# Patient Record
Sex: Female | Born: 1935
Health system: Southern US, Community
[De-identification: ages and names within clinical notes are randomized; demographics above are authoritative.]

## PROBLEM LIST (undated history)

## (undated) DIAGNOSIS — R5383 Other fatigue: Secondary | ICD-10-CM

## (undated) DIAGNOSIS — G479 Sleep disorder, unspecified: Secondary | ICD-10-CM

## (undated) DIAGNOSIS — F39 Unspecified mood [affective] disorder: Secondary | ICD-10-CM

## (undated) DIAGNOSIS — R300 Dysuria: Secondary | ICD-10-CM

## (undated) DIAGNOSIS — M4804 Spinal stenosis, thoracic region: Secondary | ICD-10-CM

## (undated) DIAGNOSIS — R011 Cardiac murmur, unspecified: Secondary | ICD-10-CM

## (undated) DIAGNOSIS — E785 Hyperlipidemia, unspecified: Secondary | ICD-10-CM

## (undated) DIAGNOSIS — K5792 Diverticulitis of intestine, part unspecified, without perforation or abscess without bleeding: Secondary | ICD-10-CM

## (undated) DIAGNOSIS — M150 Primary generalized (osteo)arthritis: Secondary | ICD-10-CM

## (undated) DIAGNOSIS — E7800 Pure hypercholesterolemia, unspecified: Secondary | ICD-10-CM

## (undated) DIAGNOSIS — G44209 Tension-type headache, unspecified, not intractable: Secondary | ICD-10-CM

## (undated) DIAGNOSIS — M859 Disorder of bone density and structure, unspecified: Secondary | ICD-10-CM

## (undated) DIAGNOSIS — I10 Essential (primary) hypertension: Secondary | ICD-10-CM

## (undated) HISTORY — DX: Disorder of bone density and structure, unspecified: M85.9

## (undated) HISTORY — DX: Cardiac murmur, unspecified: R01.1

## (undated) HISTORY — DX: Other fatigue: R53.83

## (undated) HISTORY — DX: Dysuria: R30.0

## (undated) HISTORY — DX: Pure hypercholesterolemia, unspecified: E78.00

## (undated) HISTORY — DX: Unspecified mood (affective) disorder: F39

## (undated) HISTORY — DX: Spinal stenosis, thoracic region: M48.04

## (undated) HISTORY — DX: Sleep disorder, unspecified: G47.9

## (undated) HISTORY — DX: Tension-type headache, unspecified, not intractable: G44.209

## (undated) HISTORY — PX: ABDOMINAL HYSTERECTOMY: SHX81

## (undated) HISTORY — PX: TONSILLECTOMY: SUR1361

## (undated) HISTORY — PX: APPENDECTOMY: SHX54

## (undated) HISTORY — PX: BLADDER SUSPENSION: SHX72

## (undated) HISTORY — DX: Primary generalized (osteo)arthritis: M15.0

---

## 1997-12-17 ENCOUNTER — Other Ambulatory Visit: Admission: RE | Admit: 1997-12-17 | Discharge: 1997-12-17 | Payer: Self-pay | Admitting: Obstetrics and Gynecology

## 1999-06-12 ENCOUNTER — Encounter: Admission: RE | Admit: 1999-06-12 | Discharge: 1999-06-12 | Payer: Self-pay | Admitting: Family Medicine

## 1999-06-12 ENCOUNTER — Encounter: Payer: Self-pay | Admitting: Family Medicine

## 1999-12-14 ENCOUNTER — Encounter: Payer: Self-pay | Admitting: Family Medicine

## 1999-12-14 ENCOUNTER — Encounter: Admission: RE | Admit: 1999-12-14 | Discharge: 1999-12-14 | Payer: Self-pay | Admitting: Family Medicine

## 2001-01-02 ENCOUNTER — Encounter: Payer: Self-pay | Admitting: Family Medicine

## 2001-01-02 ENCOUNTER — Encounter: Admission: RE | Admit: 2001-01-02 | Discharge: 2001-01-02 | Payer: Self-pay | Admitting: Family Medicine

## 2001-01-05 ENCOUNTER — Encounter: Payer: Self-pay | Admitting: Family Medicine

## 2001-01-05 ENCOUNTER — Encounter: Admission: RE | Admit: 2001-01-05 | Discharge: 2001-01-05 | Payer: Self-pay | Admitting: Family Medicine

## 2001-10-03 ENCOUNTER — Ambulatory Visit (HOSPITAL_COMMUNITY): Admission: RE | Admit: 2001-10-03 | Discharge: 2001-10-03 | Payer: Self-pay | Admitting: Gastroenterology

## 2002-02-05 ENCOUNTER — Encounter: Admission: RE | Admit: 2002-02-05 | Discharge: 2002-02-05 | Payer: Self-pay | Admitting: Family Medicine

## 2002-02-05 ENCOUNTER — Encounter: Payer: Self-pay | Admitting: Family Medicine

## 2002-02-14 ENCOUNTER — Encounter: Admission: RE | Admit: 2002-02-14 | Discharge: 2002-02-14 | Payer: Self-pay | Admitting: Family Medicine

## 2002-02-14 ENCOUNTER — Encounter: Payer: Self-pay | Admitting: Family Medicine

## 2002-09-17 ENCOUNTER — Other Ambulatory Visit: Admission: RE | Admit: 2002-09-17 | Discharge: 2002-09-17 | Payer: Self-pay | Admitting: Family Medicine

## 2003-02-19 ENCOUNTER — Encounter: Admission: RE | Admit: 2003-02-19 | Discharge: 2003-02-19 | Payer: Self-pay | Admitting: Family Medicine

## 2003-02-19 ENCOUNTER — Encounter: Payer: Self-pay | Admitting: Family Medicine

## 2004-01-24 ENCOUNTER — Other Ambulatory Visit: Admission: RE | Admit: 2004-01-24 | Discharge: 2004-01-24 | Payer: Self-pay | Admitting: Family Medicine

## 2004-02-20 ENCOUNTER — Encounter: Admission: RE | Admit: 2004-02-20 | Discharge: 2004-02-20 | Payer: Self-pay | Admitting: Family Medicine

## 2005-02-02 ENCOUNTER — Other Ambulatory Visit: Admission: RE | Admit: 2005-02-02 | Discharge: 2005-02-02 | Payer: Self-pay | Admitting: Family Medicine

## 2005-02-09 ENCOUNTER — Encounter: Admission: RE | Admit: 2005-02-09 | Discharge: 2005-02-09 | Payer: Self-pay | Admitting: Family Medicine

## 2005-03-04 ENCOUNTER — Encounter: Admission: RE | Admit: 2005-03-04 | Discharge: 2005-03-04 | Payer: Self-pay | Admitting: Family Medicine

## 2005-12-06 ENCOUNTER — Encounter: Admission: RE | Admit: 2005-12-06 | Discharge: 2005-12-06 | Payer: Self-pay | Admitting: Family Medicine

## 2005-12-27 ENCOUNTER — Other Ambulatory Visit: Payer: Self-pay

## 2005-12-27 ENCOUNTER — Emergency Department: Payer: Self-pay | Admitting: Unknown Physician Specialty

## 2006-04-07 ENCOUNTER — Other Ambulatory Visit: Admission: RE | Admit: 2006-04-07 | Discharge: 2006-04-07 | Payer: Self-pay | Admitting: Family Medicine

## 2006-12-26 ENCOUNTER — Encounter: Admission: RE | Admit: 2006-12-26 | Discharge: 2006-12-26 | Payer: Self-pay | Admitting: Family Medicine

## 2008-01-03 ENCOUNTER — Encounter: Admission: RE | Admit: 2008-01-03 | Discharge: 2008-01-03 | Payer: Self-pay | Admitting: Family Medicine

## 2008-01-23 ENCOUNTER — Ambulatory Visit: Payer: Self-pay | Admitting: Critical Care Medicine

## 2008-01-23 ENCOUNTER — Inpatient Hospital Stay (HOSPITAL_COMMUNITY): Admission: RE | Admit: 2008-01-23 | Discharge: 2008-02-06 | Payer: Self-pay | Admitting: Neurosurgery

## 2008-03-12 ENCOUNTER — Other Ambulatory Visit: Admission: RE | Admit: 2008-03-12 | Discharge: 2008-03-12 | Payer: Self-pay | Admitting: Family Medicine

## 2009-01-06 ENCOUNTER — Encounter: Admission: RE | Admit: 2009-01-06 | Discharge: 2009-01-06 | Payer: Self-pay | Admitting: Family Medicine

## 2009-01-15 ENCOUNTER — Encounter: Admission: RE | Admit: 2009-01-15 | Discharge: 2009-01-15 | Payer: Self-pay | Admitting: Neurosurgery

## 2009-02-03 ENCOUNTER — Encounter: Admission: RE | Admit: 2009-02-03 | Discharge: 2009-02-03 | Payer: Self-pay | Admitting: Neurosurgery

## 2009-05-07 ENCOUNTER — Encounter: Admission: RE | Admit: 2009-05-07 | Discharge: 2009-05-07 | Payer: Self-pay | Admitting: Neurosurgery

## 2009-07-14 ENCOUNTER — Encounter: Admission: RE | Admit: 2009-07-14 | Discharge: 2009-07-14 | Payer: Self-pay | Admitting: Neurosurgery

## 2009-07-17 ENCOUNTER — Encounter: Admission: RE | Admit: 2009-07-17 | Discharge: 2009-07-17 | Payer: Self-pay | Admitting: Neurosurgery

## 2009-10-23 ENCOUNTER — Encounter: Admission: RE | Admit: 2009-10-23 | Discharge: 2009-10-23 | Payer: Self-pay | Admitting: Neurosurgery

## 2010-01-21 ENCOUNTER — Encounter: Admission: RE | Admit: 2010-01-21 | Discharge: 2010-01-21 | Payer: Self-pay | Admitting: Family Medicine

## 2010-04-07 ENCOUNTER — Encounter: Admission: RE | Admit: 2010-04-07 | Discharge: 2010-04-07 | Payer: Self-pay | Admitting: Neurosurgery

## 2010-04-14 IMAGING — MG MM SCREEN MAMMOGRAM BILATERAL
4 series · 4 of 4 positions shown · non-contrast
Comparison: none

DG SCREEN MAMMOGRAM BILATERAL
Bilateral CC and MLO view(s) were taken.
Technologist: Husametin Camur

DIGITAL SCREENING MAMMOGRAM WITH CAD:
The breast tissue is extremely dense.  No masses or malignant type calcifications are identified.  
Compared with prior studies.

[R CC]
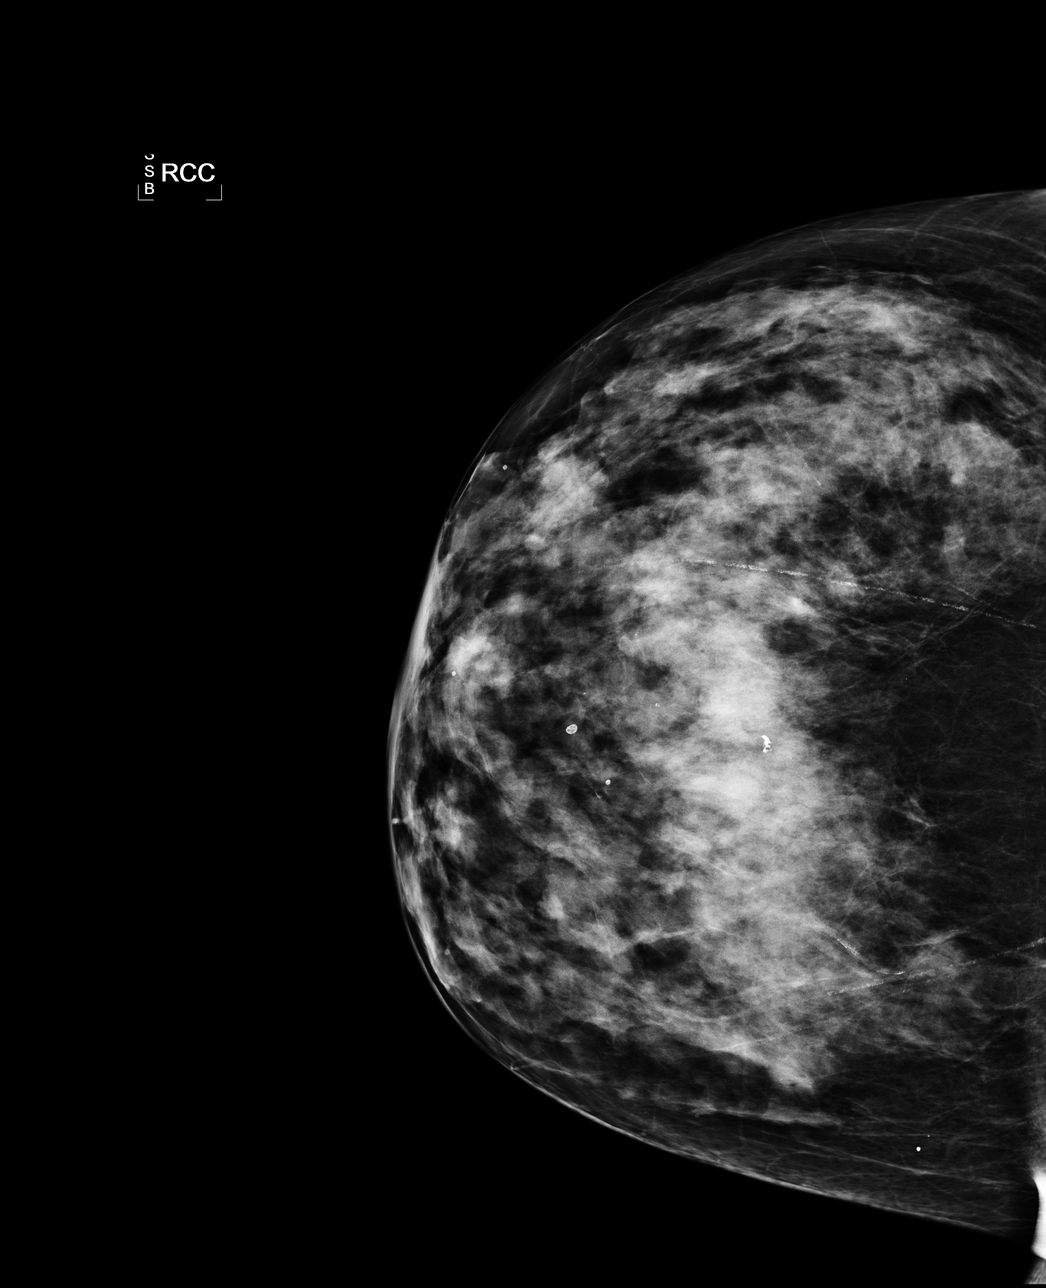

[L CC]
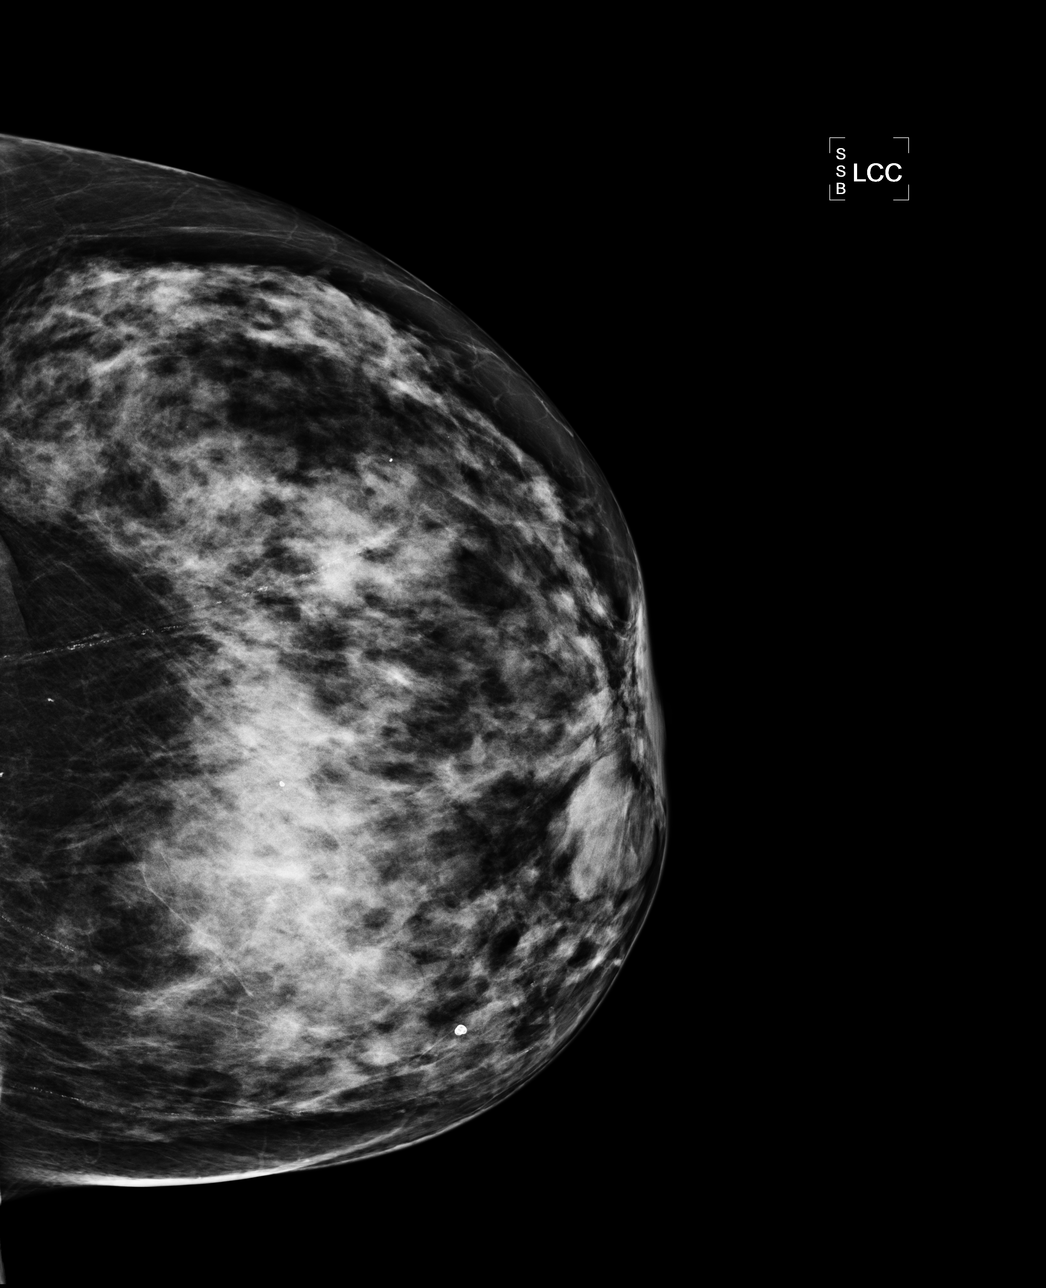

[L MLO]
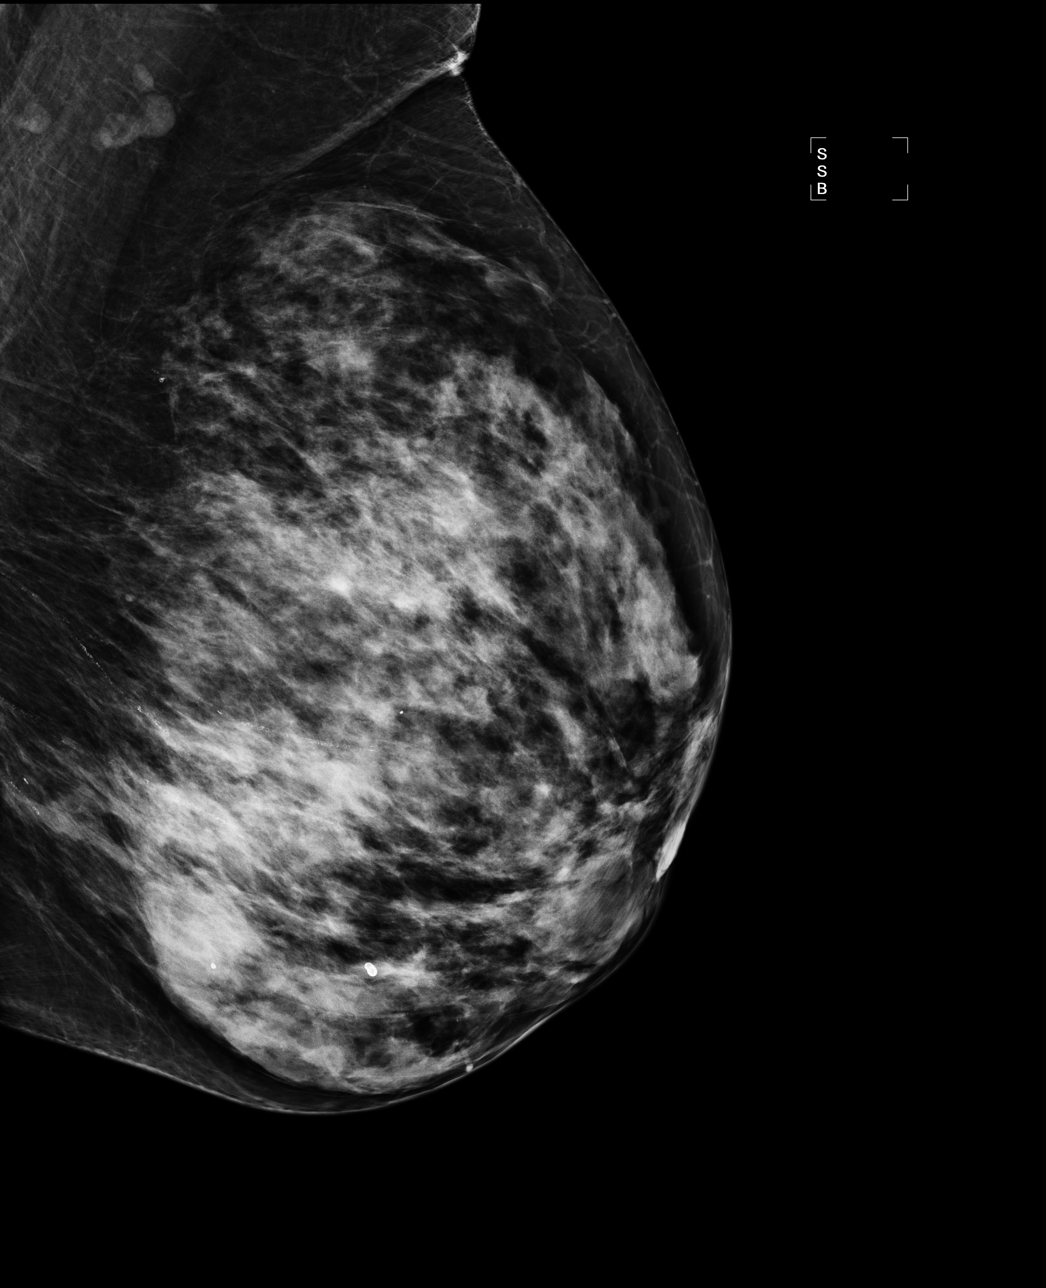

[R MLO]
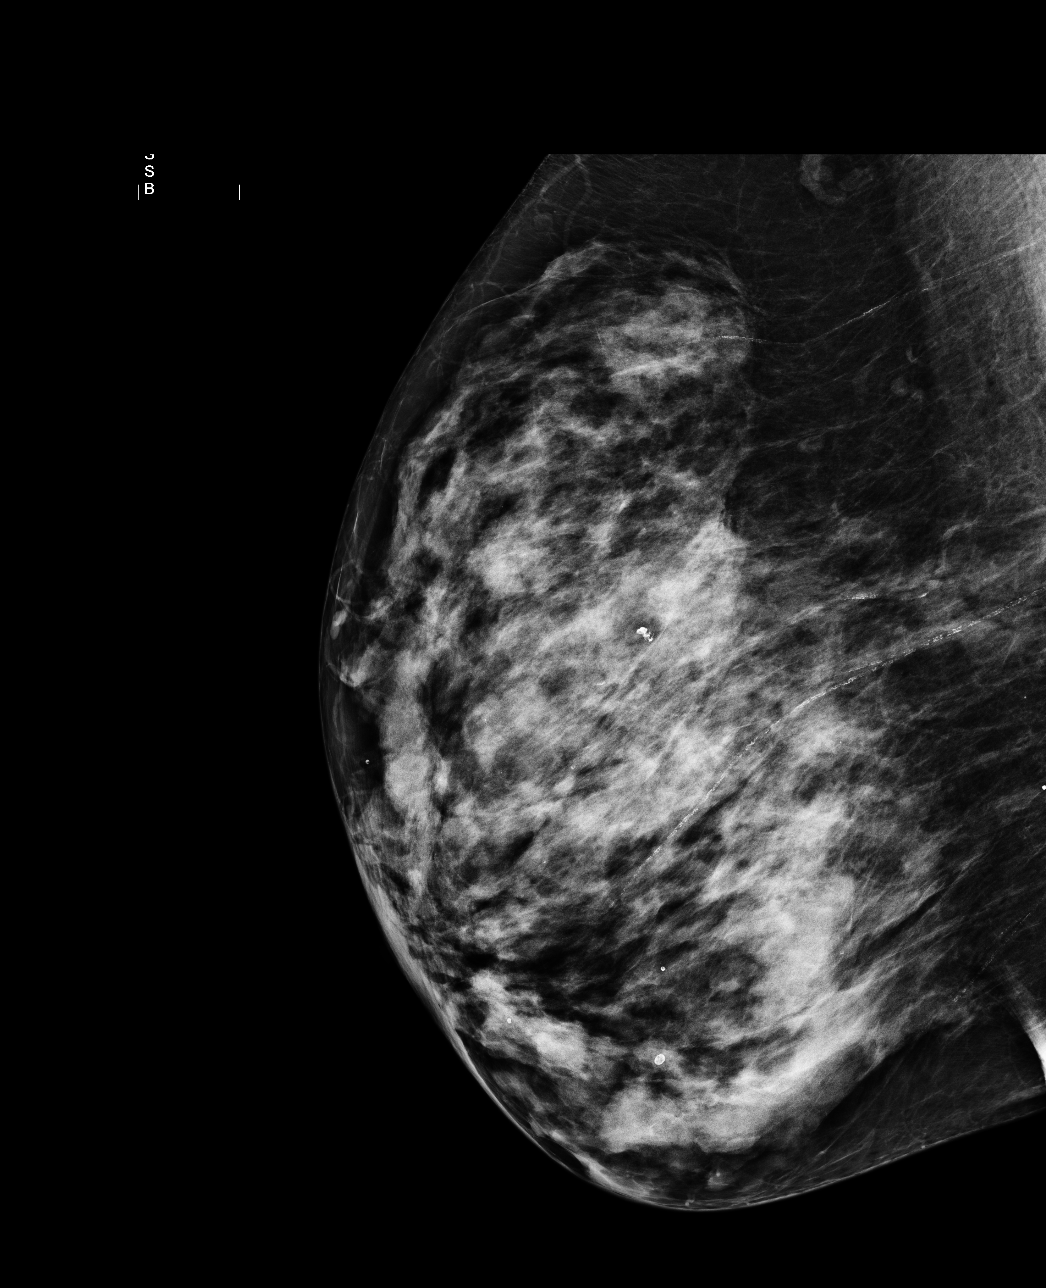

[4 of 4 positions shown; findings below may reference images not displayed]

IMPRESSION: No specific mammographic evidence of malignancy.  Next screening mammogram is recommended in one 
year.

ASSESSMENT: Negative - BI-RADS 1

Screening mammogram in 1 year.
ANALYZED BY COMPUTER AIDED DETECTION. , THIS PROCEDURE WAS A DIGITAL MAMMOGRAM.

## 2010-07-21 ENCOUNTER — Other Ambulatory Visit: Payer: Self-pay | Admitting: Neurosurgery

## 2010-07-21 DIAGNOSIS — M549 Dorsalgia, unspecified: Secondary | ICD-10-CM

## 2010-07-23 ENCOUNTER — Ambulatory Visit
Admission: RE | Admit: 2010-07-23 | Discharge: 2010-07-23 | Disposition: A | Discharge status: Home / Self Care | Payer: MEDICARE | Source: Ambulatory Visit | Attending: Neurosurgery | Admitting: Neurosurgery

## 2010-07-23 DIAGNOSIS — M549 Dorsalgia, unspecified: Secondary | ICD-10-CM

## 2010-08-03 ENCOUNTER — Other Ambulatory Visit: Payer: Self-pay | Admitting: Neurosurgery

## 2010-08-03 DIAGNOSIS — M47816 Spondylosis without myelopathy or radiculopathy, lumbar region: Secondary | ICD-10-CM

## 2010-08-06 ENCOUNTER — Ambulatory Visit
Admission: RE | Admit: 2010-08-06 | Discharge: 2010-08-06 | Disposition: A | Discharge status: Home / Self Care | Payer: MEDICARE | Source: Ambulatory Visit | Attending: Neurosurgery | Admitting: Neurosurgery

## 2010-08-06 DIAGNOSIS — M47816 Spondylosis without myelopathy or radiculopathy, lumbar region: Secondary | ICD-10-CM

## 2010-08-11 ENCOUNTER — Other Ambulatory Visit: Payer: Self-pay | Admitting: Neurosurgery

## 2010-08-11 DIAGNOSIS — M1990 Unspecified osteoarthritis, unspecified site: Secondary | ICD-10-CM

## 2010-08-14 ENCOUNTER — Inpatient Hospital Stay: Admission: RE | Admit: 2010-08-14 | Payer: No Typology Code available for payment source | Source: Ambulatory Visit

## 2010-10-06 NOTE — H&P (Signed)
Pamela Robinson, Pamela Robinson               ACCOUNT NO.:  [id number]   MEDICAL RECORD NO.:  [id number]          PATIENT TYPE:  INP   LOCATION:  2899                         FACILITY:  [facility]   PHYSICIAN:  Payton Doughty, M.D.      DATE OF BIRTH:  [birth year]   DATE OF ADMISSION:  01/23/2008  DATE OF DISCHARGE:                              HISTORY & PHYSICAL   ADMISSION DIAGNOSIS:  Cervical spondylosis from C4-T1.   The patient is very nice 77 year old right-handed self-referred white  lady working down in the yard and floor in February had the onset of  pain in her neck and dysesthesia in both arms.  MR shows cervical  spondylosis, C7-T1 subluxation, narrowing at 5-6.   MEDICAL HISTORY:  Is pretty benign.  She has history of hypertension.   MEDICATION:  She takes Diovan, atenolol, paroxetine, lovastatin,  Naprosyn, trazodone, and Fiorinal.   She is allergic to SULFA and CODEINE, as well as SHELLFISH.   SURGICAL HISTORY:  Tonsillectomy in 1946, appendectomy in 1956,  hysterectomy in 1976, and bladder tack in 1980.   SOCIAL HISTORY:  She does not smoke.  She is a social drinker.  Retired  from being a Architect.   FAMILY HISTORY:  Mom died at 57 of Alzheimer's, dad died at 9 of  natural causes.  She has a brother who died of colon cancer and sister  with breast cancer.   REVIEW OF SYSTEMS:  Remarkable for wearing glasses, hypertension,  occasional incontinence, back pain, arm pain, leg pain, joint pain,  arthritis, and neck pain.   PHYSICAL EXAMINATION:  HEENT:  Normal.  NECK:  She has somewhat limited range of motion of neck, but no  Lhermitte's.  CHEST:  Clear.  CARDIAC:  Regular rate and rhythm.  ABDOMEN:  Nontender.  No hepatosplenomegaly.  EXTREMITIES:  Without clubbing or cyanosis.  Peripheral pulses are good.  GU:  Deferred.  NEUROLOGIC:  She is awake, alert, and oriented.  Cranial nerves are  intact.  Motor exam shows 5/5 strength throughout the upper extremity.  She  has mildly positive Hoffman's bilaterally.  Reflexes are 2+ at the  biceps, 2+ at the triceps with brachioradialis, 3+ at the knees and  ankles.  No clonus and downgoing toes.   MR shows stenosis at C4-C5, C5-C6, not too bad at C6-C7, grade 2 slip C7-  T1 with narrowing at that level as well.   CLINICAL IMPRESSION:  Cervical spondylosis, early myelopathy.   PLAN:  Plan is for anterior decompression and fusion at C4-C5, C5-C6, C6-  C7, C7-T1.  If we cannot get  into C7-T1 anteriorly, we will do it from  behind.  Risks and benefits of this approach have been discussed with  her and she wishes to proceed.           ______________________________  Payton Doughty, M.D.     MWR/MEDQ  D:  01/23/2008  T:  01/23/2008  Job:  [id number]

## 2010-10-06 NOTE — Discharge Summary (Signed)
NAMEBRIANE, BIRDEN               ACCOUNT NO.:  [id number]   MEDICAL RECORD NO.:  [id number]          PATIENT TYPE:  INP   LOCATION:  3013                         FACILITY:  [facility]   PHYSICIAN:  Payton Doughty, M.D.      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   DISCHARGE DIAGNOSIS:  Cervical spondylotic myelopathy C3-4 through C6-7.   PROCEDURE:  C4-5, C5-6, C6-7, C7-T1 anterior decompression and fusion.   COMPLICATIONS:  Airway compromise and stridor.   DISCHARGE STATUS:  Alive and well.   BODY TEXT:  [name] with history and physical as recounted in  the chart.  She had been developing some weakness in her upper  extremities.  Films showed significant spondylosis C4-T1 and she was  admitted for decompression.   PHYSICAL EXAMINATION:  General and neurologic exam were intact for early  myelopathy.  History is fairly benign.  She was admitted after  ascertaining normal laboratory values and underwent the anterior  decompression from C4-5 through C7-T1.  Plate was not placed because  there was none that would fit.  Postoperatively, she initially did well  and then developed stridor with some swelling.  Next morning was taken  back to the OR for exploration of the incision.  No hematoma was found.  She did have extensive swelling.  She remained intubated for 2 days on  Decadron, swelling went down.  She self-extubated herself.  Since self-  extubation, her airway has been fine.  Her initial swallow study, she  could not swallow anything because of glottal edema.  She had a feeding  tube placed.  Three days later underwent a second swallowing study.  She  was placed on dysphagia one diet, did well on that for couple of days,  has had a further swallow study done and has had her diet advanced or  she  can drink thin liquids and take her pills in the chopped foods.  Currently, her strength is 4.  Incision is dry and well healing.  She is  tolerating her diet and is being discharged home.  Her follow up would  be in the [facility] office later this week for suture removal, and she  will have another swallow study next week to see if she can go to a  completely regular diet.           ______________________________  Payton Doughty, M.D.     MWR/MEDQ  D:  02/06/2008  T:  02/07/2008  Job:  [id number]

## 2010-10-06 NOTE — Op Note (Signed)
NAMECORTNI, TAYS NO.:  [id number]   MEDICAL RECORD NO.:  [id number]          PATIENT TYPE:  INP   LOCATION:  3111                         FACILITY:  [facility]   PHYSICIAN:  Payton Doughty, M.D.      DATE OF BIRTH:  1933-03-27   DATE OF PROCEDURE:  01/24/2008  DATE OF DISCHARGE:                               OPERATIVE REPORT   POSTOPERATIVE DIAGNOSIS:  Neck hematoma.   POSTOPERATIVE DIAGNOSIS:  Neck swelling.   PROCEDURE:  Exploration anterior cervical decompression incision.   SURGEON:  Payton Doughty, MD   COMPLICATIONS:  None.   BODY OF TEXT:  This is a 77 year old lady who had anterior decompression  and fusion done yesterday.  She developed some stridor.  Felt the  prudent thing is to explore and make sure she does not have a clot.  She  was taken to the operating room, smoothly anesthetized and intubated.  Following, prep and drape in the usual sterile fashion, the old skin  incision was reopened.  The sternocleidomastoid and carotid artery were  identified and careful dissection down through the old incision revealed  that there was really minimal if any hematoma, but rather diffuse soft  tissue swelling.  She had a large soft tissue in her neck and the  swelling probably will cause complication in the airway.  It was  irrigated with minimal clot found.  Jackson-Pratt drain placed in  prevertebral space and exited.  Successive layers of 3-0 Vicryl and 3-0  nylon were used to close.  Neosporin dressing was applied and made  occlusive with OpSite.  The patient returned to the recovery room to be  left intubated for a couple of days.           ______________________________  Payton Doughty, M.D.     MWR/MEDQ  D:  01/24/2008  T:  01/25/2008  Job:  [id number]

## 2010-10-06 NOTE — Op Note (Signed)
NAMEZULEYMA, Pamela Robinson               ACCOUNT NO.:  [id number]   MEDICAL RECORD NO.:  [id number]          PATIENT TYPE:  INP   LOCATION:  2899                         FACILITY:  [facility]   PHYSICIAN:  Payton Doughty, M.D.      DATE OF BIRTH:  08/13/1932   DATE OF PROCEDURE:  01/23/2008  DATE OF DISCHARGE:                               OPERATIVE REPORT   PREOPERATIVE DIAGNOSIS:  Spondylosis with myelopathy at C4-C5, C5-C6, C6-  C7, and C7-T1.   POSTOPERATIVE DIAGNOSIS:  Spondylosis with myelopathy at C4-C5, C5-C6,  C6-C7, and C7-T1.   OPERATIVE PROCEDURE:  C4-C5, C5-C6, C6-C7, C7-T1 anterior cervical  decompression and fusion.   SURGEON:  Payton Doughty, MD   ANESTHESIA:  General endotracheal.   PREPARATION:  Prepped and draped with alcohol wipe.   COMPLICATIONS:  None.   NURSE ASSISTANT:  [place].   DOCTOR ASSISTANT:  Cristi Loron, MD   BODY OF TEXT:  [name] with cervical spondylitic myelopathy.  She had grade 1 slip at C7-T1.  Taken to the operating room, smoothly  anesthetized and intubated, placed supine on the operating table, on the  halter head traction with the neck very slightly extended.  Following  shave, prep, and drape in usual sterile fashion, the skin was incised  from the sternal notch paralleling sternocleidomastoid muscle for  distance of approximately 10 cm.  The platysma was identified, elevated,  divided, and undermined.  Sternocleidomastoid was identified.  Medial  dissection revealed the carotid artery tracked laterally to left.  Trachea and esophagus retracted laterally to the right exposing the  bones in the anterior cervical spine.  Marker was placed.  Intraoperative x-ray obtained to confirm correct level and confirmed  correct level.  We took down the longus colli bilaterally and placed the  Shadow-Line self-retaining retractor.  Under gross observation, C4-C5,  C5-C6, C6-C7, and C7-T1 diskectomies were carried out.  We then brought  in  the operating microscope and used microdissection technique to remove  the remaining disks, remove the offending bone spurs, divide the  posterior longitudinal ligament, and explore the neural foramina  bilaterally.  Complete decompression at each level was obtained.  There  was a small slip at C7-T1.  Grossly, there was no instability after the  diskectomies.  The 8-mm bone grafts were fashioned with patellar  allograft and tapped into place at each site.  It was then attempted to  place Reflex hybrid plate, but because of the patient's anatomy the  holes on the 4-level plates would not line up with the native bone and  allow placement of screws.  There was an attempt made to place two 2-  level plates and once again the screws would not line up in a  satisfactory position.  It was therefore elected to forego a plate and  placed the patient in a collar and it was felt that she developed signs  of instability, she could be augmented from behind.  Therefore,  successive layers of 3-0 Vicryl and 4-0 Vicryl were used to close.  A  Jackson-Pratt drain was placed in  the prevertebral space connected by a  separate incision.  The esophagus was carefully inspected prior to  closure and found be free of lesions.  After closing, the patient was  then taken to recovery room in good condition.           ______________________________  Payton Doughty, M.D.     MWR/MEDQ  D:  01/23/2008  T:  01/24/2008  Job:  [id number]

## 2010-10-09 NOTE — Procedures (Signed)
[facility]  Patient:    Pamela Robinson, Pamela Robinson Visit Number: [id number] MRN: [id number]          Service Type: END Location: ENDO Attending Physician:  Orland Mustard Dictated by:   Llana Aliment. Randa Evens, M.D. Proc. Date: 10/03/01 Admit Date:  10/03/2001   CC:         Consuella Lose C. Valentina Lucks, M.D.   Procedure Report  DATE OF BIRTH:  06/21/1932  PROCEDURE:  Colonoscopy.  MEDICATIONS:  Fentanyl 125 mcg, Versed 10 mg IV.  SCOPE:  Olympus pediatric video colonoscope.  INDICATIONS FOR PROCEDURE:  Strong positive family history of colon cancer, brother, maternal uncle, paternal uncle all have had colon cancer. Brother and sister have also had colon polyps. For this reason, she is on protocol for every five year screening colonoscopy. This is done as a five year follow-up.  DESCRIPTION OF PROCEDURE:  The procedure had been explained to the patient and consent obtained. With the patient in the left lateral decubitus position, the Olympus pediatric video colonoscope was inserted and advanced under direct visualization. The prep was excellent. We were able to reach the cecum using abdominal pressure and position changes. The scope was withdrawn and the cecum, ascending colon, hepatic flexure, transverse colon, splenic flexure, descending and sigmoid colon were seen well. No polyps or other lesions were seen. The scope was withdrawn. The patient tolerated the procedure well with no abnormalities. She was maintained on low flow oxygen and pulse oximeter throughout the procedure.  ASSESSMENT:  Normal colonoscopy in high risk individual.  PLAN:  Yearly Hemoccults and will recommend repeating procedure in five years. Dictated by:   Llana Aliment. Randa Evens, M.D. Attending Physician:  Orland Mustard DD:  10/03/01 TD:  10/04/01 Job: [id number]

## 2010-11-24 ENCOUNTER — Other Ambulatory Visit: Payer: Self-pay | Admitting: Family Medicine

## 2010-11-24 ENCOUNTER — Other Ambulatory Visit (HOSPITAL_COMMUNITY)
Admission: RE | Admit: 2010-11-24 | Discharge: 2010-11-24 | Disposition: A | Discharge status: Home / Self Care | Payer: Medicare Other | Source: Ambulatory Visit | Attending: Family Medicine | Admitting: Family Medicine

## 2010-11-24 DIAGNOSIS — Z124 Encounter for screening for malignant neoplasm of cervix: Secondary | ICD-10-CM | POA: Insufficient documentation

## 2010-12-21 ENCOUNTER — Other Ambulatory Visit: Payer: Self-pay | Admitting: Family Medicine

## 2010-12-21 DIAGNOSIS — Z1231 Encounter for screening mammogram for malignant neoplasm of breast: Secondary | ICD-10-CM

## 2011-01-27 ENCOUNTER — Ambulatory Visit
Admission: RE | Admit: 2011-01-27 | Discharge: 2011-01-27 | Disposition: A | Discharge status: Home / Self Care | Payer: Medicare Other | Source: Ambulatory Visit | Attending: Family Medicine | Admitting: Family Medicine

## 2011-01-27 DIAGNOSIS — Z1231 Encounter for screening mammogram for malignant neoplasm of breast: Secondary | ICD-10-CM

## 2011-02-24 LAB — BASIC METABOLIC PANEL WITH GFR
BUN: 5 — ABNORMAL LOW
BUN: 6
BUN: 6
CO2: 27
CO2: 30
CO2: 30
Calcium: 8.2 — ABNORMAL LOW
Calcium: 8.4
Calcium: 8.7
Chloride: 105
Chloride: 92 — ABNORMAL LOW
Chloride: 98
Creatinine, Ser: 0.45
Creatinine, Ser: 0.5
Creatinine, Ser: 0.52
GFR calc non Af Amer: >60
GFR calc non Af Amer: >60
GFR calc non Af Amer: >60
Glucose, Bld: 131 — ABNORMAL HIGH
Glucose, Bld: 138 — ABNORMAL HIGH
Glucose, Bld: 170 — ABNORMAL HIGH
Potassium: 3.2 — ABNORMAL LOW
Potassium: 3.3 — ABNORMAL LOW
Potassium: 3.6
Sodium: 130 — ABNORMAL LOW
Sodium: 135
Sodium: 140

## 2011-02-24 LAB — HEMOGLOBIN AND HEMATOCRIT, BLOOD: HCT: 28 — ABNORMAL LOW

## 2011-02-24 LAB — CULTURE, BAL-QUANTITATIVE W GRAM STAIN: Colony Count: 75000

## 2011-02-24 LAB — CBC
HCT: 25.9 — ABNORMAL LOW
HCT: 30.8 — ABNORMAL LOW
HCT: 32.5 — ABNORMAL LOW
Hemoglobin: 11.4 — ABNORMAL LOW
Hemoglobin: 9 — ABNORMAL LOW
MCHC: 34.3
MCHC: 34.8
MCHC: 34.8
MCHC: 35.2
MCV: 88.7
MCV: 90.1
MCV: 90.8
Platelets: 190
Platelets: 260
Platelets: 286
RBC: 2.9 — ABNORMAL LOW
RBC: 3.67 — ABNORMAL LOW
RDW: 11.8
RDW: 11.9
RDW: 12
WBC: 11.6 — ABNORMAL HIGH

## 2011-02-24 LAB — BASIC METABOLIC PANEL
BUN: 2 — ABNORMAL LOW
CO2: 30
Calcium: 8.9
Chloride: 97
Chloride: 99
GFR calc Af Amer: >60
Glucose, Bld: 131 — ABNORMAL HIGH
Glucose, Bld: 138 — ABNORMAL HIGH
Potassium: 2.9 — ABNORMAL LOW
Potassium: 4.1
Sodium: 136

## 2011-02-24 LAB — APTT: aPTT: 32

## 2011-02-24 LAB — GLUCOSE, CAPILLARY
Glucose-Capillary: 134 — ABNORMAL HIGH
Glucose-Capillary: 157 — ABNORMAL HIGH
Glucose-Capillary: 161 — ABNORMAL HIGH
Glucose-Capillary: 168 — ABNORMAL HIGH

## 2011-02-24 LAB — BLOOD GAS, ARTERIAL
Acid-Base Excess: 4.7 — ABNORMAL HIGH
Bicarbonate: 27.6 — ABNORMAL HIGH
Drawn by: 277331
FIO2: 0.5
RATE: 14
TCO2: 28.6
pCO2 arterial: 33.1 — ABNORMAL LOW
pO2, Arterial: 109 — ABNORMAL HIGH

## 2011-02-24 LAB — DIFFERENTIAL
Eosinophils Absolute: 0.1
Eosinophils Relative: 1
Monocytes Absolute: 1.1 — ABNORMAL HIGH

## 2011-02-24 LAB — PROTIME-INR
INR: 1.1
Prothrombin Time: 14

## 2011-02-24 LAB — IRON AND TIBC
Iron: 17 — ABNORMAL LOW
Saturation Ratios: 10 — ABNORMAL LOW
TIBC: 173 — ABNORMAL LOW
UIBC: 156

## 2011-02-24 LAB — RETICULOCYTES
RBC.: 3.53 — ABNORMAL LOW
Retic Count, Absolute: 45.9
Retic Ct Pct: 1.3

## 2011-02-24 LAB — VITAMIN B12: Vitamin B-12: 448 (ref 211–911)

## 2011-02-24 LAB — PHOSPHORUS: Phosphorus: 3.3

## 2011-02-24 LAB — FERRITIN: Ferritin: 529 — ABNORMAL HIGH (ref 10–291)

## 2011-06-24 ENCOUNTER — Other Ambulatory Visit: Payer: Self-pay | Admitting: Neurosurgery

## 2011-06-24 DIAGNOSIS — M47816 Spondylosis without myelopathy or radiculopathy, lumbar region: Secondary | ICD-10-CM

## 2011-06-29 ENCOUNTER — Ambulatory Visit
Admission: RE | Admit: 2011-06-29 | Discharge: 2011-06-29 | Disposition: A | Discharge status: Home / Self Care | Payer: Medicare Other | Source: Ambulatory Visit | Attending: Neurosurgery | Admitting: Neurosurgery

## 2011-06-29 DIAGNOSIS — M47816 Spondylosis without myelopathy or radiculopathy, lumbar region: Secondary | ICD-10-CM

## 2011-06-29 DIAGNOSIS — M5126 Other intervertebral disc displacement, lumbar region: Secondary | ICD-10-CM

## 2011-06-29 MED ORDER — METHYLPREDNISOLONE ACETATE 40 MG/ML INJ SUSP (RADIOLOG
120.0000 mg | Freq: Once | INTRAMUSCULAR | Status: AC
  Administered 2011-06-29: 120 mg via EPIDURAL

## 2011-06-29 MED ORDER — IOHEXOL 180 MG/ML  SOLN
1.0000 mL | Freq: Once | INTRAMUSCULAR | Status: AC | PRN
  Administered 2011-06-29: 1 mL via EPIDURAL

## 2011-08-13 ENCOUNTER — Other Ambulatory Visit: Payer: Self-pay | Admitting: Neurosurgery

## 2011-08-13 DIAGNOSIS — M47812 Spondylosis without myelopathy or radiculopathy, cervical region: Secondary | ICD-10-CM

## 2011-08-18 ENCOUNTER — Ambulatory Visit
Admission: RE | Admit: 2011-08-18 | Discharge: 2011-08-18 | Disposition: A | Discharge status: Home / Self Care | Payer: Medicare Other | Source: Ambulatory Visit | Attending: Neurosurgery | Admitting: Neurosurgery

## 2011-08-18 DIAGNOSIS — M47812 Spondylosis without myelopathy or radiculopathy, cervical region: Secondary | ICD-10-CM

## 2011-09-21 ENCOUNTER — Other Ambulatory Visit: Payer: Self-pay | Admitting: Neurosurgery

## 2011-09-21 DIAGNOSIS — M47817 Spondylosis without myelopathy or radiculopathy, lumbosacral region: Secondary | ICD-10-CM

## 2011-09-22 ENCOUNTER — Ambulatory Visit
Admission: RE | Admit: 2011-09-22 | Discharge: 2011-09-22 | Disposition: A | Discharge status: Home / Self Care | Payer: Medicare Other | Source: Ambulatory Visit | Attending: Neurosurgery | Admitting: Neurosurgery

## 2011-09-22 DIAGNOSIS — M47817 Spondylosis without myelopathy or radiculopathy, lumbosacral region: Secondary | ICD-10-CM

## 2011-09-22 MED ORDER — IOHEXOL 180 MG/ML  SOLN
1.0000 mL | Freq: Once | INTRAMUSCULAR | Status: AC | PRN
  Administered 2011-09-22: 1 mL via EPIDURAL

## 2011-09-22 MED ORDER — METHYLPREDNISOLONE ACETATE 40 MG/ML INJ SUSP (RADIOLOG
120.0000 mg | Freq: Once | INTRAMUSCULAR | Status: AC
  Administered 2011-09-22: 120 mg via EPIDURAL

## 2012-02-18 ENCOUNTER — Other Ambulatory Visit: Payer: Self-pay | Admitting: Neurosurgery

## 2012-02-18 DIAGNOSIS — M47816 Spondylosis without myelopathy or radiculopathy, lumbar region: Secondary | ICD-10-CM

## 2012-02-21 ENCOUNTER — Ambulatory Visit
Admission: RE | Admit: 2012-02-21 | Discharge: 2012-02-21 | Disposition: A | Discharge status: Home / Self Care | Payer: Medicare Other | Source: Ambulatory Visit | Attending: Neurosurgery | Admitting: Neurosurgery

## 2012-02-21 DIAGNOSIS — M47816 Spondylosis without myelopathy or radiculopathy, lumbar region: Secondary | ICD-10-CM

## 2012-02-21 MED ORDER — METHYLPREDNISOLONE ACETATE 40 MG/ML INJ SUSP (RADIOLOG
120.0000 mg | Freq: Once | INTRAMUSCULAR | Status: AC
  Administered 2012-02-21: 120 mg via EPIDURAL

## 2012-02-21 MED ORDER — IOHEXOL 180 MG/ML  SOLN
1.0000 mL | Freq: Once | INTRAMUSCULAR | Status: AC | PRN
  Administered 2012-02-21: 1 mL via EPIDURAL

## 2012-02-28 ENCOUNTER — Other Ambulatory Visit: Payer: Self-pay | Admitting: Family Medicine

## 2012-02-28 ENCOUNTER — Ambulatory Visit
Admission: RE | Admit: 2012-02-28 | Discharge: 2012-02-28 | Disposition: A | Discharge status: Home / Self Care | Payer: Medicare Other | Source: Ambulatory Visit | Attending: Family Medicine | Admitting: Family Medicine

## 2012-02-28 DIAGNOSIS — Z1231 Encounter for screening mammogram for malignant neoplasm of breast: Secondary | ICD-10-CM

## 2012-02-28 DIAGNOSIS — Z803 Family history of malignant neoplasm of breast: Secondary | ICD-10-CM

## 2012-04-11 ENCOUNTER — Other Ambulatory Visit: Payer: Self-pay | Admitting: Gastroenterology

## 2012-04-11 DIAGNOSIS — K921 Melena: Secondary | ICD-10-CM

## 2012-04-26 ENCOUNTER — Ambulatory Visit
Admission: RE | Admit: 2012-04-26 | Discharge: 2012-04-26 | Disposition: A | Discharge status: Home / Self Care | Payer: Medicare Other | Source: Ambulatory Visit | Attending: Gastroenterology | Admitting: Gastroenterology

## 2012-04-26 ENCOUNTER — Other Ambulatory Visit: Payer: Self-pay | Admitting: Gastroenterology

## 2012-04-26 DIAGNOSIS — K921 Melena: Secondary | ICD-10-CM

## 2012-05-31 ENCOUNTER — Other Ambulatory Visit: Payer: Self-pay | Admitting: Neurosurgery

## 2012-05-31 DIAGNOSIS — M47816 Spondylosis without myelopathy or radiculopathy, lumbar region: Secondary | ICD-10-CM

## 2012-06-05 ENCOUNTER — Ambulatory Visit
Admission: RE | Admit: 2012-06-05 | Discharge: 2012-06-05 | Disposition: A | Discharge status: Home / Self Care | Payer: Medicare Other | Source: Ambulatory Visit | Attending: Neurosurgery | Admitting: Neurosurgery

## 2012-06-05 DIAGNOSIS — M47816 Spondylosis without myelopathy or radiculopathy, lumbar region: Secondary | ICD-10-CM

## 2012-06-05 MED ORDER — IOHEXOL 180 MG/ML  SOLN
1.0000 mL | Freq: Once | INTRAMUSCULAR | Status: AC | PRN
  Administered 2012-06-05: 1 mL via EPIDURAL

## 2012-06-05 MED ORDER — METHYLPREDNISOLONE ACETATE 40 MG/ML INJ SUSP (RADIOLOG
120.0000 mg | Freq: Once | INTRAMUSCULAR | Status: AC
  Administered 2012-06-05: 120 mg via EPIDURAL

## 2013-02-19 ENCOUNTER — Other Ambulatory Visit: Payer: Self-pay | Admitting: Neurosurgery

## 2013-02-19 DIAGNOSIS — M47816 Spondylosis without myelopathy or radiculopathy, lumbar region: Secondary | ICD-10-CM

## 2013-02-26 ENCOUNTER — Other Ambulatory Visit: Payer: Self-pay

## 2013-02-26 DIAGNOSIS — Z1231 Encounter for screening mammogram for malignant neoplasm of breast: Secondary | ICD-10-CM

## 2013-02-27 ENCOUNTER — Ambulatory Visit
Admission: RE | Admit: 2013-02-27 | Discharge: 2013-02-27 | Disposition: A | Discharge status: Home / Self Care | Payer: Commercial Managed Care - HMO | Source: Ambulatory Visit | Attending: Neurosurgery | Admitting: Neurosurgery

## 2013-02-27 DIAGNOSIS — M47816 Spondylosis without myelopathy or radiculopathy, lumbar region: Secondary | ICD-10-CM

## 2013-02-27 MED ORDER — METHYLPREDNISOLONE ACETATE 40 MG/ML INJ SUSP (RADIOLOG
120.0000 mg | Freq: Once | INTRAMUSCULAR | Status: AC
  Administered 2013-02-27: 120 mg via EPIDURAL

## 2013-02-27 MED ORDER — METHYLPREDNISOLONE ACETATE 40 MG/ML INJ SUSP (RADIOLOG: 120.0000 mg | Freq: Once | INTRAMUSCULAR | Status: DC

## 2013-02-27 MED ORDER — IOHEXOL 180 MG/ML  SOLN
1.0000 mL | Freq: Once | INTRAMUSCULAR | Status: AC | PRN
  Administered 2013-02-27: 1 mL via EPIDURAL

## 2013-02-28 ENCOUNTER — Ambulatory Visit
Admission: RE | Admit: 2013-02-28 | Discharge: 2013-02-28 | Disposition: A | Discharge status: Home / Self Care | Payer: Commercial Managed Care - HMO | Source: Ambulatory Visit

## 2013-02-28 DIAGNOSIS — Z1231 Encounter for screening mammogram for malignant neoplasm of breast: Secondary | ICD-10-CM

## 2013-03-05 ENCOUNTER — Other Ambulatory Visit: Payer: Self-pay | Admitting: Family Medicine

## 2013-03-05 DIAGNOSIS — R928 Other abnormal and inconclusive findings on diagnostic imaging of breast: Secondary | ICD-10-CM

## 2013-03-16 ENCOUNTER — Ambulatory Visit
Admission: RE | Admit: 2013-03-16 | Discharge: 2013-03-16 | Disposition: A | Discharge status: Home / Self Care | Payer: Medicare Other | Source: Ambulatory Visit | Attending: Family Medicine | Admitting: Family Medicine

## 2013-03-16 DIAGNOSIS — R928 Other abnormal and inconclusive findings on diagnostic imaging of breast: Secondary | ICD-10-CM

## 2013-08-08 ENCOUNTER — Other Ambulatory Visit: Payer: Self-pay | Admitting: Family Medicine

## 2013-08-08 DIAGNOSIS — N6320 Unspecified lump in the left breast, unspecified quadrant: Secondary | ICD-10-CM

## 2013-08-22 ENCOUNTER — Encounter (HOSPITAL_BASED_OUTPATIENT_CLINIC_OR_DEPARTMENT_OTHER): Payer: Self-pay | Admitting: Emergency Medicine

## 2013-08-22 ENCOUNTER — Emergency Department (HOSPITAL_BASED_OUTPATIENT_CLINIC_OR_DEPARTMENT_OTHER)
Admission: EM | Admit: 2013-08-22 | Discharge: 2013-08-22 | Disposition: A | Discharge status: Home / Self Care | Payer: Medicare HMO | Attending: Emergency Medicine | Admitting: Emergency Medicine

## 2013-08-22 ENCOUNTER — Emergency Department (HOSPITAL_BASED_OUTPATIENT_CLINIC_OR_DEPARTMENT_OTHER): Payer: Medicare HMO

## 2013-08-22 DIAGNOSIS — Z862 Personal history of diseases of the blood and blood-forming organs and certain disorders involving the immune mechanism: Secondary | ICD-10-CM | POA: Insufficient documentation

## 2013-08-22 DIAGNOSIS — R630 Anorexia: Secondary | ICD-10-CM | POA: Insufficient documentation

## 2013-08-22 DIAGNOSIS — I10 Essential (primary) hypertension: Secondary | ICD-10-CM | POA: Insufficient documentation

## 2013-08-22 DIAGNOSIS — K5792 Diverticulitis of intestine, part unspecified, without perforation or abscess without bleeding: Secondary | ICD-10-CM

## 2013-08-22 DIAGNOSIS — K5732 Diverticulitis of large intestine without perforation or abscess without bleeding: Secondary | ICD-10-CM | POA: Insufficient documentation

## 2013-08-22 DIAGNOSIS — Z9889 Other specified postprocedural states: Secondary | ICD-10-CM | POA: Insufficient documentation

## 2013-08-22 DIAGNOSIS — Z8639 Personal history of other endocrine, nutritional and metabolic disease: Secondary | ICD-10-CM | POA: Insufficient documentation

## 2013-08-22 HISTORY — DX: Hyperlipidemia, unspecified: E78.5

## 2013-08-22 HISTORY — DX: Diverticulitis of intestine, part unspecified, without perforation or abscess without bleeding: K57.92

## 2013-08-22 HISTORY — DX: Essential (primary) hypertension: I10

## 2013-08-22 LAB — CBC WITH DIFFERENTIAL/PLATELET
BASOS ABS: 0.1 10*3/uL (ref 0.0–0.1)
Basophils Relative: 1 % (ref 0–1)
EOS ABS: 0.1 10*3/uL (ref 0.0–0.7)
EOS PCT: 1 % (ref 0–5)
HEMATOCRIT: 35.8 % — AB (ref 36.0–46.0)
Hemoglobin: 12.7 g/dL (ref 12.0–15.0)
Lymphocytes Relative: 23 % (ref 12–46)
Lymphs Abs: 2.1 10*3/uL (ref 0.7–4.0)
MCH: 31.8 pg (ref 26.0–34.0)
MCHC: 35.5 g/dL (ref 30.0–36.0)
MCV: 89.7 fL (ref 78.0–100.0)
MONO ABS: 0.9 10*3/uL (ref 0.1–1.0)
Monocytes Relative: 10 % (ref 3–12)
Neutro Abs: 5.8 10*3/uL (ref 1.7–7.7)
Neutrophils Relative %: 65 % (ref 43–77)
PLATELETS: 231 10*3/uL (ref 150–400)
RBC: 3.99 MIL/uL (ref 3.87–5.11)
RDW: 11.7 % (ref 11.5–15.5)
WBC: 9 10*3/uL (ref 4.0–10.5)

## 2013-08-22 LAB — URINALYSIS, ROUTINE W REFLEX MICROSCOPIC
BILIRUBIN URINE: NEGATIVE
Glucose, UA: NEGATIVE mg/dL
HGB URINE DIPSTICK: NEGATIVE
KETONES UR: NEGATIVE mg/dL
Nitrite: NEGATIVE
PROTEIN: NEGATIVE mg/dL
Specific Gravity, Urine: 1.015 (ref 1.005–1.030)
UROBILINOGEN UA: 0.2 mg/dL (ref 0.0–1.0)
pH: 6 (ref 5.0–8.0)

## 2013-08-22 LAB — COMPREHENSIVE METABOLIC PANEL
ALT: 13 U/L (ref 0–35)
AST: 19 U/L (ref 0–37)
Albumin: 3.9 g/dL (ref 3.5–5.2)
Alkaline Phosphatase: 77 U/L (ref 39–117)
BUN: 20 mg/dL (ref 6–23)
CALCIUM: 9.6 mg/dL (ref 8.4–10.5)
CO2: 32 mEq/L (ref 19–32)
CREATININE: 1.5 mg/dL — AB (ref 0.50–1.10)
Chloride: 95 mEq/L — ABNORMAL LOW (ref 96–112)
GFR, EST AFRICAN AMERICAN: 37 mL/min — AB (ref >90)
GFR, EST NON AFRICAN AMERICAN: 32 mL/min — AB (ref >90)
Glucose, Bld: 110 mg/dL — ABNORMAL HIGH (ref 70–99)
Potassium: 3.4 mEq/L — ABNORMAL LOW (ref 3.7–5.3)
SODIUM: 141 meq/L (ref 137–147)
TOTAL PROTEIN: 7.4 g/dL (ref 6.0–8.3)
Total Bilirubin: 0.5 mg/dL (ref 0.3–1.2)

## 2013-08-22 LAB — URINE MICROSCOPIC-ADD ON

## 2013-08-22 MED ORDER — IOHEXOL 300 MG/ML  SOLN
100.0000 mL | Freq: Once | INTRAMUSCULAR | Status: AC | PRN
  Administered 2013-08-22: 75 mL via INTRAVENOUS

## 2013-08-22 MED ORDER — IOHEXOL 300 MG/ML  SOLN
50.0000 mL | Freq: Once | INTRAMUSCULAR | Status: AC | PRN
  Administered 2013-08-22: 50 mL via ORAL

## 2013-08-22 MED ORDER — SODIUM CHLORIDE 0.9 % IV BOLUS (SEPSIS)
1000.0000 mL | Freq: Once | INTRAVENOUS | Status: AC
  Administered 2013-08-22: 1000 mL via INTRAVENOUS

## 2013-08-22 MED ORDER — ONDANSETRON HCL 4 MG PO TABS: 4.0000 mg | ORAL_TABLET | Freq: Four times a day (QID) | ORAL | Status: DC

## 2013-08-22 MED ORDER — METRONIDAZOLE 500 MG PO TABS: 500.0000 mg | ORAL_TABLET | Freq: Two times a day (BID) | ORAL | Status: DC

## 2013-08-22 MED ORDER — MORPHINE SULFATE 4 MG/ML IJ SOLN
4.0000 mg | Freq: Once | INTRAMUSCULAR | Status: AC
  Administered 2013-08-22: 4 mg via INTRAVENOUS
  Filled 2013-08-22: qty 1

## 2013-08-22 MED ORDER — CIPROFLOXACIN HCL 500 MG PO TABS: 500.0000 mg | ORAL_TABLET | Freq: Two times a day (BID) | ORAL | Status: DC

## 2013-08-22 MED ORDER — ONDANSETRON HCL 4 MG/2ML IJ SOLN
4.0000 mg | Freq: Once | INTRAMUSCULAR | Status: AC
  Administered 2013-08-22: 4 mg via INTRAVENOUS
  Filled 2013-08-22: qty 2

## 2013-08-22 NOTE — ED Notes (Signed)
MD at bedside. 

## 2013-08-22 NOTE — ED Notes (Signed)
Patient transported to CT 

## 2013-08-22 NOTE — ED Notes (Signed)
Pt reports abdominal pain in LLQ shooting to RLQ, reports hx of diverticulitis and thinks this is related. Also reports constipation. Nausea but denies vomiting. Intermittent fever.

## 2013-08-22 NOTE — Discharge Instructions (Signed)
Diverticulitis °A diverticulum is a small pouch or sac on the colon. Diverticulosis is the presence of these diverticula on the colon. Diverticulitis is the irritation (inflammation) or infection of diverticula. °CAUSES  °The colon and its diverticula contain bacteria. If food particles block the tiny opening to a diverticulum, the bacteria inside can grow and cause an increase in pressure. This leads to infection and inflammation and is called diverticulitis. °SYMPTOMS  °· Abdominal pain and tenderness. Usually, the pain is located on the left side of your abdomen. However, it could be located elsewhere. °· Fever. °· Bloating. °· Feeling sick to your stomach (nausea). °· Throwing up (vomiting). °· Abnormal stools. °DIAGNOSIS  °Your caregiver will take a history and perform a physical exam. Since many things can cause abdominal pain, other tests may be necessary. Tests may include: °· Blood tests. °· Urine tests. °· X-ray of the abdomen. °· CT scan of the abdomen. °Sometimes, surgery is needed to determine if diverticulitis or other conditions are causing your symptoms. °TREATMENT  °Most of the time, you can be treated without surgery. Treatment includes: °· Resting the bowels by only having liquids for a few days. As you improve, you will need to eat a low-fiber diet. °· Intravenous (IV) fluids if you are losing body fluids (dehydrated). °· Antibiotic medicines that treat infections may be given. °· Pain and nausea medicine, if needed. °· Surgery if the inflamed diverticulum has burst. °HOME CARE INSTRUCTIONS  °· Try a clear liquid diet (broth, tea, or water for as long as directed by your caregiver). You may then gradually begin a low-fiber diet as tolerated.  °A low-fiber diet is a diet with less than 10 grams of fiber. Choose the foods below to reduce fiber in the diet: °· White breads, cereals, rice, and pasta. °· Cooked fruits and vegetables or soft fresh fruits and vegetables without the skin. °· Ground or  well-cooked tender beef, ham, veal, lamb, pork, or poultry. °· Eggs and seafood. °· After your diverticulitis symptoms have improved, your caregiver may put you on a high-fiber diet. A high-fiber diet includes 14 grams of fiber for every 1000 calories consumed. For a standard 2000 calorie diet, you would need 28 grams of fiber. Follow these diet guidelines to help you increase the fiber in your diet. It is important to slowly increase the amount fiber in your diet to avoid gas, constipation, and bloating. °· Choose whole-grain breads, cereals, pasta, and brown rice. °· Choose fresh fruits and vegetables with the skin on. Do not overcook vegetables because the more vegetables are cooked, the more fiber is lost. °· Choose more nuts, seeds, legumes, dried peas, beans, and lentils. °· Look for food products that have greater than 3 grams of fiber per serving on the Nutrition Facts label. °· Take all medicine as directed by your caregiver. °· If your caregiver has given you a follow-up appointment, it is very important that you go. Not going could result in lasting (chronic) or permanent injury, pain, and disability. If there is any problem keeping the appointment, call to reschedule. °SEEK MEDICAL CARE IF:  °· Your pain does not improve. °· You have a hard time advancing your diet beyond clear liquids. °· Your bowel movements do not return to normal. °SEEK IMMEDIATE MEDICAL CARE IF:  °· Your pain becomes worse. °· You have an oral temperature above 102° F (38.9° C), not controlled by medicine. °· You have repeated vomiting. °· You have bloody or black, tarry stools. °·   Symptoms that brought you to your caregiver become worse or are not getting better. °MAKE SURE YOU:  °· Understand these instructions. °· Will watch your condition. °· Will get help right away if you are not doing well or get worse. °Document Released: 02/17/2005 Document Revised: 08/02/2011 Document Reviewed: 06/15/2010 °[employer]® Patient Information  ©2014 [employer]. ° °

## 2013-08-22 NOTE — ED Notes (Signed)
Family at bedside. 

## 2013-08-22 NOTE — ED Notes (Signed)
Pt drinking PO contrast

## 2013-08-22 NOTE — ED Provider Notes (Addendum)
CSN: [id number]     Arrival date & time 08/22/13  1433 History   First MD Initiated Contact with Patient 08/22/13 1518     Chief Complaint  Patient presents with  . Abdominal Pain     (Consider location/radiation/quality/duration/timing/severity/associated sxs/prior Treatment) Patient is a 80 y.o. female presenting with abdominal pain. The history is provided by the patient.  Abdominal Pain Pain location:  LLQ Pain quality: aching, cramping, sharp and shooting   Pain radiates to:  RUQ Pain severity:  Severe Onset quality:  Gradual Duration:  1 week Timing:  Constant Progression:  Worsening Chronicity:  New Context: eating   Context: not alcohol use   Relieved by:  Nothing Worsened by:  Eating and movement Ineffective treatments:  None tried Associated symptoms: anorexia, chills, constipation, fever and nausea   Associated symptoms: no chest pain, no cough, no diarrhea, no dysuria, no hematemesis, no shortness of breath and no vomiting   Risk factors: being elderly   Risk factors comment:  S/p appy and complete hysterectomy   Past Medical History  Diagnosis Date  . Diverticulitis   . Hypertension   . Hyperlipidemia    Past Surgical History  Procedure Laterality Date  . Tonsillectomy    . Abdominal hysterectomy    . Appendectomy    . Bladder suspension     History reviewed. No pertinent family history. History  Substance Use Topics  . Smoking status: Never Smoker   . Smokeless tobacco: Never Used  . Alcohol Use: Yes     Comment: minimal, occasional wine   OB History   Grav Para Term Preterm Abortions TAB SAB Ect Mult Living                 Review of Systems  Constitutional: Positive for fever and chills.  Respiratory: Negative for cough and shortness of breath.   Cardiovascular: Negative for chest pain.  Gastrointestinal: Positive for nausea, abdominal pain, constipation and anorexia. Negative for vomiting, diarrhea and hematemesis.  Genitourinary: Negative  for dysuria.  All other systems reviewed and are negative.      Allergies  Shellfish allergy; Codeine; and Sulfa antibiotics  Home Medications  No current outpatient prescriptions on file. BP 157/72  Pulse 74  Temp(Src) 98.4 F (36.9 C) (Oral)  Resp 16  Ht 5\' 5"  (1.651 m)  Wt 138 lb (62.596 kg)  BMI 22.96 kg/m2  SpO2 96% Physical Exam  Nursing note and vitals reviewed. Constitutional: She is oriented to person, place, and time. She appears well-developed and well-nourished. No distress.  HENT:  Head: Normocephalic and atraumatic.  Mouth/Throat: Oropharynx is clear and moist.  Eyes: Conjunctivae and EOM are normal. Pupils are equal, round, and reactive to light.  Neck: Normal range of motion. Neck supple.  Cardiovascular: Normal rate, regular rhythm and intact distal pulses.   No murmur heard. Pulmonary/Chest: Effort normal and breath sounds normal. No respiratory distress. She has no wheezes. She has no rales.  Abdominal: Soft. Normal appearance. She exhibits no distension. There is tenderness in the left lower quadrant. There is guarding. There is no rebound and no CVA tenderness.  Musculoskeletal: Normal range of motion. She exhibits no edema and no tenderness.  Neurological: She is alert and oriented to person, place, and time.  Skin: Skin is warm and dry. No rash noted. No erythema.  Psychiatric: She has a normal mood and affect. Her behavior is normal.    ED Course  Procedures (including critical care time) Labs Review Labs Reviewed  URINALYSIS, ROUTINE W REFLEX MICROSCOPIC - Abnormal; Notable for the following:    APPearance CLOUDY (*)    Leukocytes, UA SMALL (*)    All other components within normal limits  CBC WITH DIFFERENTIAL - Abnormal; Notable for the following:    HCT 35.8 (*)    All other components within normal limits  COMPREHENSIVE METABOLIC PANEL - Abnormal; Notable for the following:    Potassium 3.4 (*)    Chloride 95 (*)    Glucose, Bld 110  (*)    Creatinine, Ser 1.50 (*)    GFR calc non Af Amer 32 (*)    GFR calc Af Amer 37 (*)    All other components within normal limits  URINE MICROSCOPIC-ADD ON - Abnormal; Notable for the following:    Squamous Epithelial / LPF FEW (*)    Bacteria, UA FEW (*)    All other components within normal limits   Imaging Review Ct Abdomen Pelvis W Contrast  08/22/2013   CLINICAL DATA:  Left lower quadrant abdominal pain radiating into the right lower quadrant  EXAM: CT ABDOMEN AND PELVIS WITH CONTRAST  TECHNIQUE: Multidetector CT imaging of the abdomen and pelvis was performed using the standard protocol following bolus administration of intravenous contrast.  CONTRAST:  75mL OMNIPAQUE IOHEXOL 300 MG/ML SOLN, 46mL OMNIPAQUE IOHEXOL 300 MG/ML SOLN  COMPARISON:  Prior MRI lumbar spine 07/23/2010  FINDINGS: Lower Chest: Dependent atelectasis. The visualized lung bases are other clear. Moderate image degradation secondary to respiratory motion. Cardiomegaly with right atrial enlargement. No pericardial effusion. Atherosclerotic calcifications in the coronary arteries. Calcification of the aortic valve leaflets. Small hiatal hernia.  Abdomen: Unremarkable CT appearance of the stomach, duodenum, spleen, adrenal glands and pancreas save for mild fatty atrophy. 9 mm low-attenuation lesion in hepatic segment 2 is too small for accurate characterization. No other discrete hepatic lesion identified. Normal morphology and contours. Hepatic and portal veins are patent. Gallbladder is unremarkable. No intra or extrahepatic biliary ductal dilatation.  Unremarkable appearance of the bilateral kidneys. No focal solid lesion, hydronephrosis or nephrolithiasis.  Sigmoid colonic diverticulosis. There is faint haziness in the pericolonic fat about the sigmoid colon concerning for early uncomplicated diverticulitis. No free air or abscess. No evidence of bowel obstruction. The appendix is surgically absent. Terminal ileum is  unremarkable. No free fluid or suspicious adenopathy.  Pelvis: Surgical changes of prior hysterectomy. The bladder is unremarkable. No free fluid or suspicious adenopathy.  Bones/Soft Tissues: No acute fracture or aggressive appearing lytic or blastic osseous lesion. Multilevel lower lumbar degenerative disc disease. Chronic L1 compression fracture with less than 20% height loss anteriorly.  Vascular: Atherosclerotic vascular disease without aneurysmal dilatation.  IMPRESSION: 1. Suspect early uncomplicated sigmoid diverticulitis. 2. Cardiomegaly with right atrial enlargement. 3. Nonspecific sub cm low-attenuation lesion in hepatic segment 2 is too small for accurate characterization. Statistically, this is likely a benign cyst. If the patient has a history of a known primary malignancy consider follow-up MRI of the abdomen with and without contrast in 3 months. 4. Small hiatal hernia.   Electronically Signed   By: Jacqulynn Cadet M.D.   On: 08/22/2013 19:17     EKG Interpretation None      MDM   Final diagnoses:  Diverticulitis    Patient with symptoms concerning for diverticulitis with worsening left lower part her pain, nausea and low-grade fever for the last one week. She denies any bloody stools her prior history of similar. No recent antibiotic use. Vital signs stable the  patient has exquisite tenderness in the left lower quadrant.  CBC within normal limits. UA with small leukocytes 11-20 white blood cells but denies any urinary symptoms. CMP and CT of the abdomen and pelvis pending. Patient given IV fluids pain and nausea control.  7:50 PM Ct with evidence of uncomplicated diverticulitis.  Pt tolerating po's and will d/c with cipro and flagyl and f/u with Dr. Oletta Lamas her GI doc.  Blanchie Dessert, MD 08/22/13 1951  Blanchie Dessert, MD 08/22/13 1610

## 2013-09-20 ENCOUNTER — Other Ambulatory Visit: Payer: Self-pay | Admitting: Family Medicine

## 2013-09-20 ENCOUNTER — Ambulatory Visit
Admission: RE | Admit: 2013-09-20 | Discharge: 2013-09-20 | Disposition: A | Discharge status: Home / Self Care | Payer: Commercial Managed Care - HMO | Source: Ambulatory Visit | Attending: Family Medicine | Admitting: Family Medicine

## 2013-09-20 DIAGNOSIS — N6320 Unspecified lump in the left breast, unspecified quadrant: Secondary | ICD-10-CM

## 2014-02-14 ENCOUNTER — Other Ambulatory Visit: Payer: Self-pay | Admitting: Family Medicine

## 2014-02-14 DIAGNOSIS — N6489 Other specified disorders of breast: Secondary | ICD-10-CM

## 2014-03-07 ENCOUNTER — Encounter (INDEPENDENT_AMBULATORY_CARE_PROVIDER_SITE_OTHER): Payer: Self-pay

## 2014-03-07 ENCOUNTER — Ambulatory Visit
Admission: RE | Admit: 2014-03-07 | Discharge: 2014-03-07 | Disposition: A | Discharge status: Home / Self Care | Payer: Commercial Managed Care - HMO | Source: Ambulatory Visit | Attending: Family Medicine | Admitting: Family Medicine

## 2014-03-07 DIAGNOSIS — N6489 Other specified disorders of breast: Secondary | ICD-10-CM

## 2014-10-03 DIAGNOSIS — K5732 Diverticulitis of large intestine without perforation or abscess without bleeding: Secondary | ICD-10-CM | POA: Diagnosis not present

## 2014-10-03 DIAGNOSIS — E780 Pure hypercholesterolemia: Secondary | ICD-10-CM | POA: Diagnosis not present

## 2014-10-03 DIAGNOSIS — M150 Primary generalized (osteo)arthritis: Secondary | ICD-10-CM | POA: Diagnosis not present

## 2014-10-03 DIAGNOSIS — I10 Essential (primary) hypertension: Secondary | ICD-10-CM | POA: Diagnosis not present

## 2014-10-03 DIAGNOSIS — M4804 Spinal stenosis, thoracic region: Secondary | ICD-10-CM | POA: Diagnosis not present

## 2014-10-03 DIAGNOSIS — Z0000 Encounter for general adult medical examination without abnormal findings: Secondary | ICD-10-CM | POA: Diagnosis not present

## 2014-10-03 DIAGNOSIS — M859 Disorder of bone density and structure, unspecified: Secondary | ICD-10-CM | POA: Diagnosis not present

## 2014-10-03 DIAGNOSIS — G44209 Tension-type headache, unspecified, not intractable: Secondary | ICD-10-CM | POA: Diagnosis not present

## 2014-10-04 DIAGNOSIS — H02831 Dermatochalasis of right upper eyelid: Secondary | ICD-10-CM | POA: Diagnosis not present

## 2014-10-04 DIAGNOSIS — H18001 Unspecified corneal deposit, right eye: Secondary | ICD-10-CM | POA: Diagnosis not present

## 2014-10-04 DIAGNOSIS — H43812 Vitreous degeneration, left eye: Secondary | ICD-10-CM | POA: Diagnosis not present

## 2014-10-04 DIAGNOSIS — H26492 Other secondary cataract, left eye: Secondary | ICD-10-CM | POA: Diagnosis not present

## 2014-12-26 DIAGNOSIS — H26492 Other secondary cataract, left eye: Secondary | ICD-10-CM | POA: Diagnosis not present

## 2014-12-26 DIAGNOSIS — H2640 Unspecified secondary cataract: Secondary | ICD-10-CM | POA: Diagnosis not present

## 2015-02-06 ENCOUNTER — Other Ambulatory Visit: Payer: Self-pay

## 2015-02-06 DIAGNOSIS — Z1231 Encounter for screening mammogram for malignant neoplasm of breast: Secondary | ICD-10-CM

## 2015-03-04 ENCOUNTER — Ambulatory Visit
Admission: RE | Admit: 2015-03-04 | Discharge: 2015-03-04 | Disposition: A | Discharge status: Home / Self Care | Payer: Commercial Managed Care - HMO | Source: Ambulatory Visit

## 2015-03-04 DIAGNOSIS — Z1231 Encounter for screening mammogram for malignant neoplasm of breast: Secondary | ICD-10-CM | POA: Diagnosis not present

## 2015-04-02 DIAGNOSIS — Z23 Encounter for immunization: Secondary | ICD-10-CM | POA: Diagnosis not present

## 2015-06-25 DIAGNOSIS — R0602 Shortness of breath: Secondary | ICD-10-CM | POA: Diagnosis not present

## 2015-06-25 DIAGNOSIS — J209 Acute bronchitis, unspecified: Secondary | ICD-10-CM | POA: Diagnosis not present

## 2015-07-15 DIAGNOSIS — Z1211 Encounter for screening for malignant neoplasm of colon: Secondary | ICD-10-CM | POA: Diagnosis not present

## 2015-07-31 DIAGNOSIS — M1711 Unilateral primary osteoarthritis, right knee: Secondary | ICD-10-CM | POA: Diagnosis not present

## 2015-09-02 ENCOUNTER — Other Ambulatory Visit: Payer: Self-pay | Admitting: Dermatology

## 2015-09-02 DIAGNOSIS — D239 Other benign neoplasm of skin, unspecified: Secondary | ICD-10-CM | POA: Diagnosis not present

## 2015-09-02 DIAGNOSIS — C44329 Squamous cell carcinoma of skin of other parts of face: Secondary | ICD-10-CM | POA: Diagnosis not present

## 2015-10-09 ENCOUNTER — Other Ambulatory Visit: Payer: Self-pay | Admitting: Dermatology

## 2015-10-09 DIAGNOSIS — C44319 Basal cell carcinoma of skin of other parts of face: Secondary | ICD-10-CM | POA: Diagnosis not present

## 2015-10-09 DIAGNOSIS — C44329 Squamous cell carcinoma of skin of other parts of face: Secondary | ICD-10-CM | POA: Diagnosis not present

## 2015-11-13 DIAGNOSIS — G479 Sleep disorder, unspecified: Secondary | ICD-10-CM | POA: Diagnosis not present

## 2015-11-13 DIAGNOSIS — M4804 Spinal stenosis, thoracic region: Secondary | ICD-10-CM | POA: Diagnosis not present

## 2015-11-13 DIAGNOSIS — E7800 Pure hypercholesterolemia, unspecified: Secondary | ICD-10-CM | POA: Diagnosis not present

## 2015-11-13 DIAGNOSIS — I10 Essential (primary) hypertension: Secondary | ICD-10-CM | POA: Diagnosis not present

## 2015-12-02 IMAGING — CT CT ABD-PELV W/ CM
2 of 5 series · 15 of 46 positions shown, 17 images · IV contrast (APPLIED)
Comparison: Prior MRI lumbar spine 07/23/2010

CLINICAL DATA: Left lower quadrant abdominal pain radiating into
the right lower quadrant

EXAM:
CT ABDOMEN AND PELVIS WITH CONTRAST
TECHNIQUE: Multidetector CT imaging of the abdomen and pelvis was performed
using the standard protocol following bolus administration of
intravenous contrast.
CONTRAST:  50mL OMNIPAQUE IOHEXOL 300 MG/ML SOLN, 75mL OMNIPAQUE
IOHEXOL 300 MG/ML SOLN

[Series 3: abd/pelvis 5.0 b31f · axial · 0.74mm/px · z∈[-397,-2]mm · 12 of 89 slices shown, 14 images]
[im 5/89  soft-tissue]
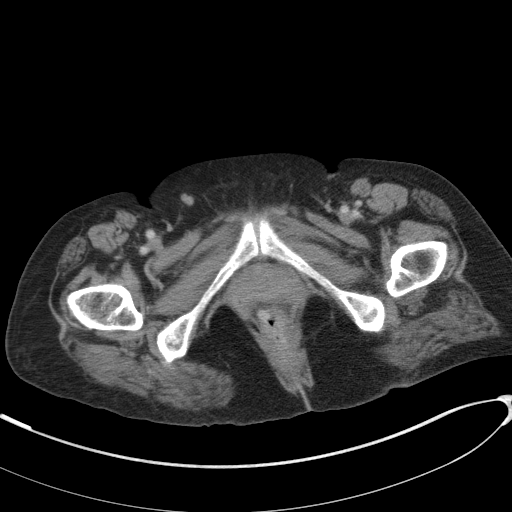
[im 5/89  bone]
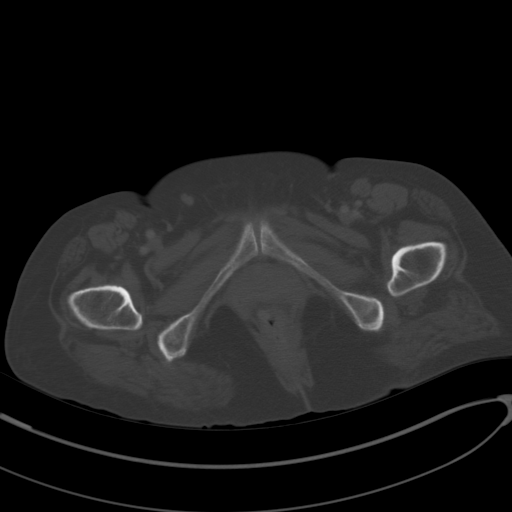
[im 14/89  soft-tissue]
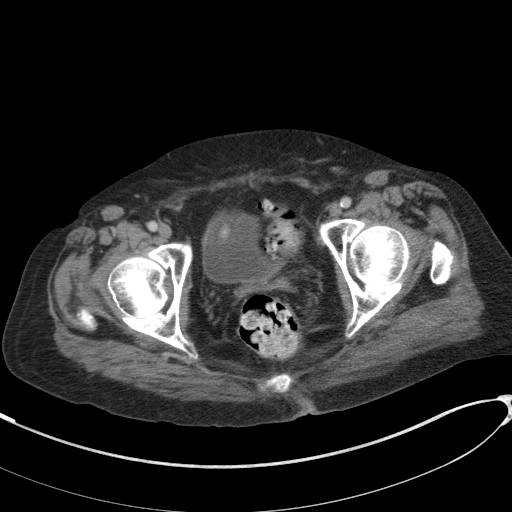
[im 19/89  soft-tissue]
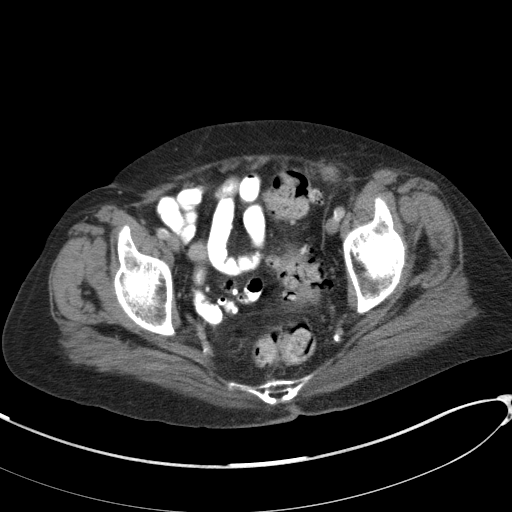
[im 28/89  soft-tissue]
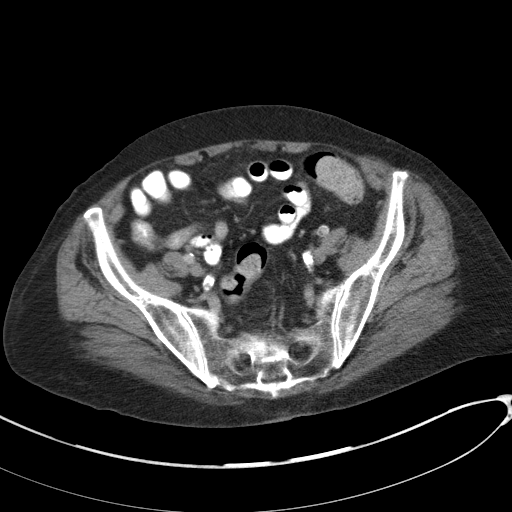
[im 33/89  soft-tissue]
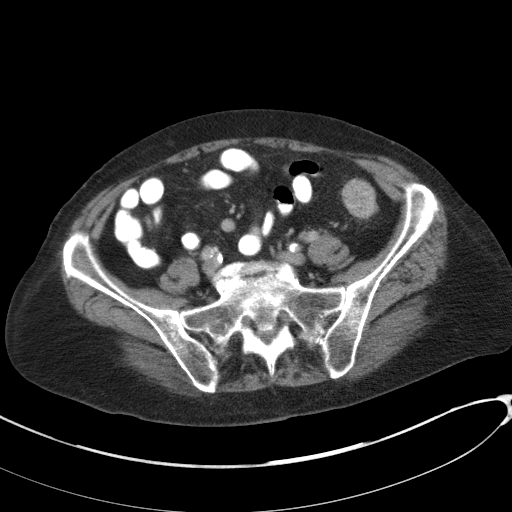
[im 42/89  soft-tissue]
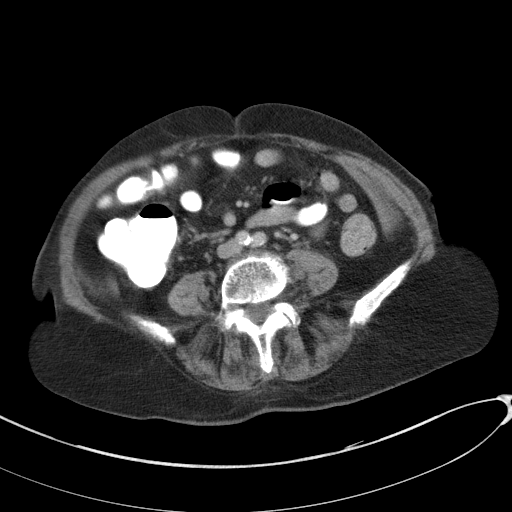
[im 47/89  soft-tissue]
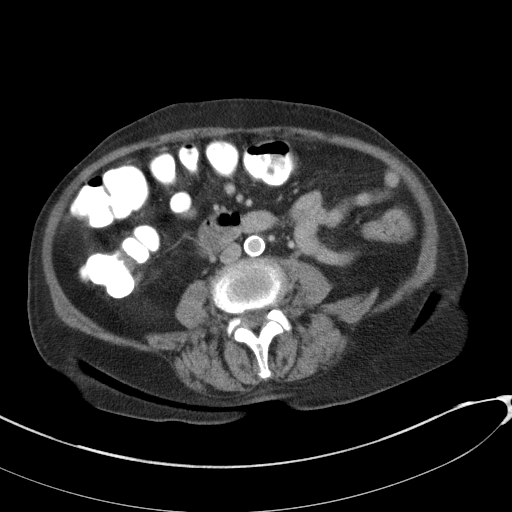
[im 56/89  soft-tissue]
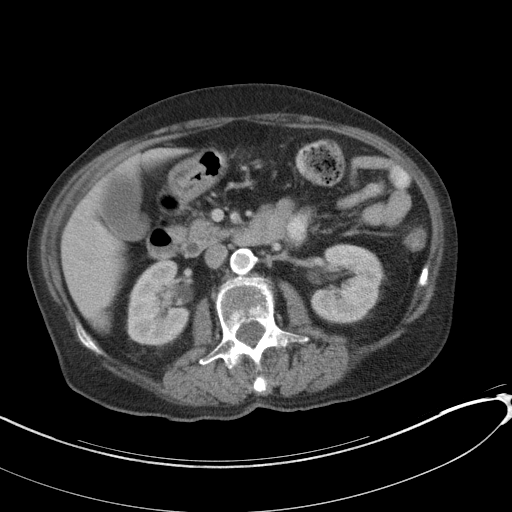
[im 61/89  soft-tissue]
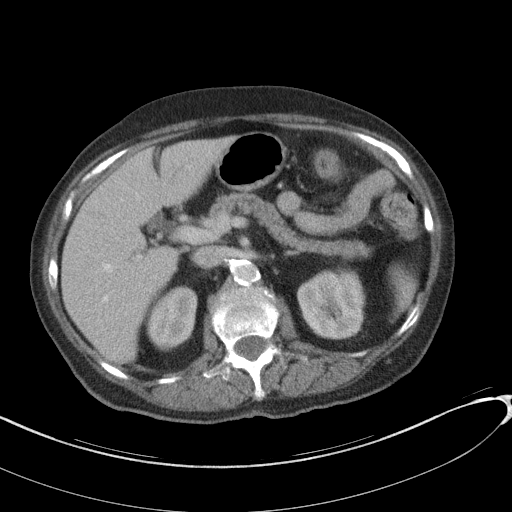
[im 61/89  bone]
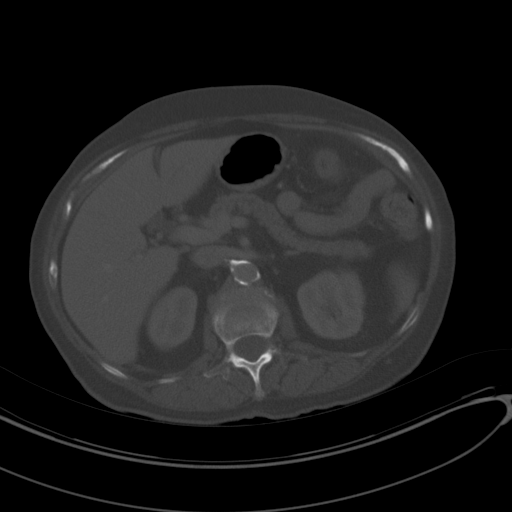
[im 70/89  soft-tissue]
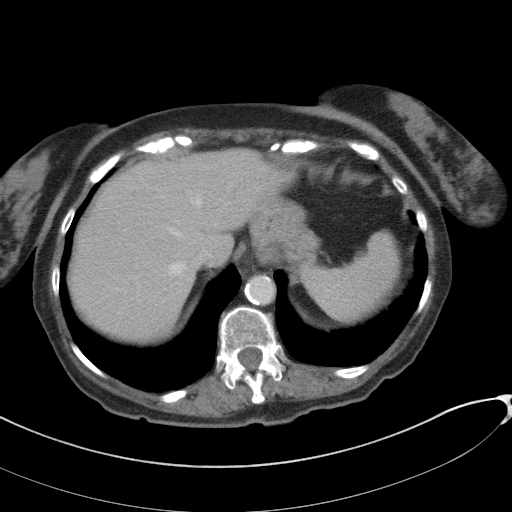
[im 75/89  soft-tissue]
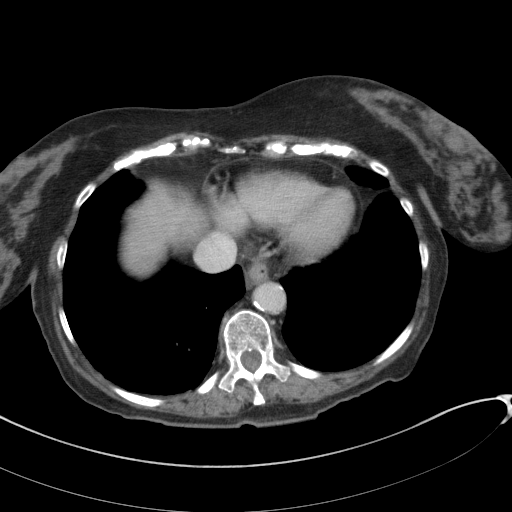
[im 84/89  soft-tissue]
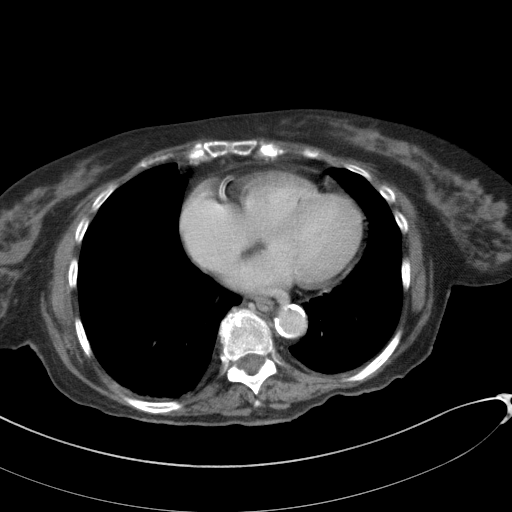

[Series 6: abd/pelvis 3.0 coronal · coronal · 0.66mm/px · 3 of 78 slices shown]
[im 26/78  soft-tissue]
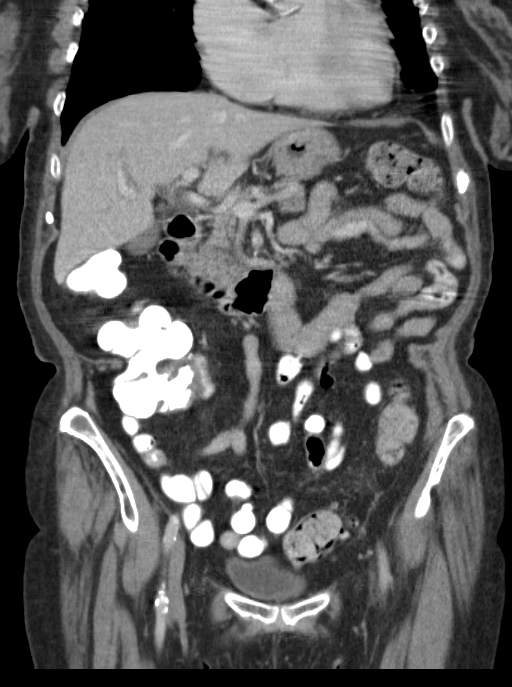
[im 35/78  soft-tissue]
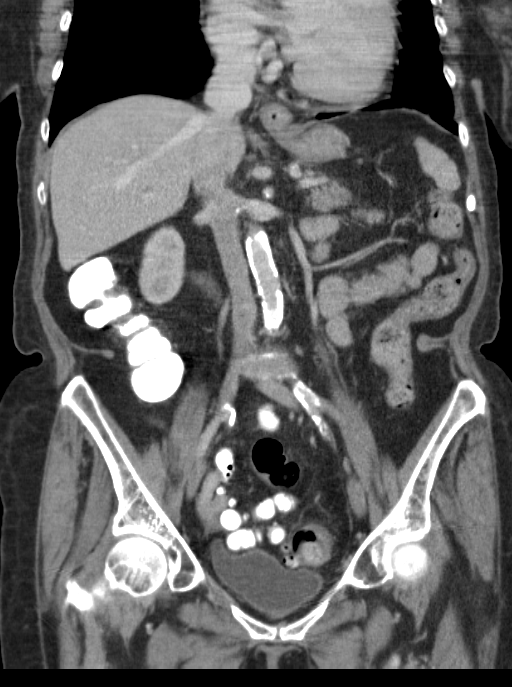
[im 43/78  soft-tissue]
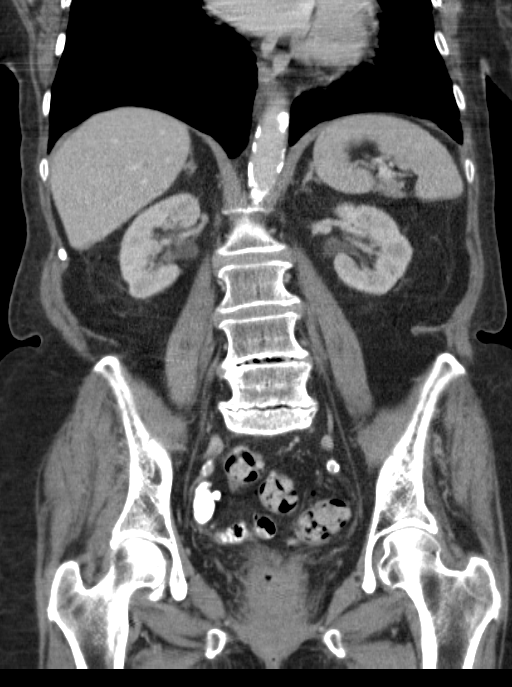

[15 of 46 positions shown; findings below may reference images not displayed]

FINDINGS: Lower Chest: Dependent atelectasis. The visualized lung bases are
other clear. Moderate image degradation secondary to respiratory
motion. Cardiomegaly with right atrial enlargement. No pericardial
effusion. Atherosclerotic calcifications in the coronary arteries.
Calcification of the aortic valve leaflets. Small hiatal hernia.

Abdomen: Unremarkable CT appearance of the stomach, duodenum,
spleen, adrenal glands and pancreas save for mild fatty atrophy. 9
mm low-attenuation lesion in hepatic segment 2 is too small for
accurate characterization. No other discrete hepatic lesion
identified. Normal morphology and contours. Hepatic and portal veins
are patent. Gallbladder is unremarkable. No intra or extrahepatic
biliary ductal dilatation.

Unremarkable appearance of the bilateral kidneys. No focal solid
lesion, hydronephrosis or nephrolithiasis.

Sigmoid colonic diverticulosis. There is faint haziness in the
pericolonic fat about the sigmoid colon concerning for early
uncomplicated diverticulitis. No free air or abscess. No evidence of
bowel obstruction. The appendix is surgically absent. Terminal ileum
is unremarkable. No free fluid or suspicious adenopathy.

Pelvis: Surgical changes of prior hysterectomy. The bladder is
unremarkable. No free fluid or suspicious adenopathy.

Bones/Soft Tissues: No acute fracture or aggressive appearing lytic
or blastic osseous lesion. Multilevel lower lumbar degenerative disc
disease. Chronic L1 compression fracture with less than 20% height
loss anteriorly.

Vascular: Atherosclerotic vascular disease without aneurysmal
dilatation.
IMPRESSION: 1. Suspect early uncomplicated sigmoid diverticulitis.
2. Cardiomegaly with right atrial enlargement.
3. Nonspecific sub cm low-attenuation lesion in hepatic segment 2 is
too small for accurate characterization. Statistically, this is
likely a benign cyst. If the patient has a history of a known
primary malignancy consider follow-up MRI of the abdomen with and
without contrast in 3 months.
4. Small hiatal hernia.

## 2016-01-28 ENCOUNTER — Other Ambulatory Visit: Payer: Self-pay | Admitting: Family Medicine

## 2016-01-28 DIAGNOSIS — Z1231 Encounter for screening mammogram for malignant neoplasm of breast: Secondary | ICD-10-CM

## 2016-02-12 DIAGNOSIS — M170 Bilateral primary osteoarthritis of knee: Secondary | ICD-10-CM | POA: Diagnosis not present

## 2016-02-12 DIAGNOSIS — M25561 Pain in right knee: Secondary | ICD-10-CM | POA: Diagnosis not present

## 2016-02-12 DIAGNOSIS — M25562 Pain in left knee: Secondary | ICD-10-CM | POA: Diagnosis not present

## 2016-02-12 DIAGNOSIS — M1712 Unilateral primary osteoarthritis, left knee: Secondary | ICD-10-CM | POA: Diagnosis not present

## 2016-02-12 DIAGNOSIS — R262 Difficulty in walking, not elsewhere classified: Secondary | ICD-10-CM | POA: Diagnosis not present

## 2016-02-19 DIAGNOSIS — M25562 Pain in left knee: Secondary | ICD-10-CM | POA: Diagnosis not present

## 2016-02-19 DIAGNOSIS — M170 Bilateral primary osteoarthritis of knee: Secondary | ICD-10-CM | POA: Diagnosis not present

## 2016-02-19 DIAGNOSIS — R262 Difficulty in walking, not elsewhere classified: Secondary | ICD-10-CM | POA: Diagnosis not present

## 2016-02-19 DIAGNOSIS — M25561 Pain in right knee: Secondary | ICD-10-CM | POA: Diagnosis not present

## 2016-02-24 DIAGNOSIS — M25561 Pain in right knee: Secondary | ICD-10-CM | POA: Diagnosis not present

## 2016-02-24 DIAGNOSIS — M1711 Unilateral primary osteoarthritis, right knee: Secondary | ICD-10-CM | POA: Diagnosis not present

## 2016-02-26 DIAGNOSIS — M25562 Pain in left knee: Secondary | ICD-10-CM | POA: Diagnosis not present

## 2016-02-26 DIAGNOSIS — M1712 Unilateral primary osteoarthritis, left knee: Secondary | ICD-10-CM | POA: Diagnosis not present

## 2016-02-26 DIAGNOSIS — R262 Difficulty in walking, not elsewhere classified: Secondary | ICD-10-CM | POA: Diagnosis not present

## 2016-02-27 DIAGNOSIS — Z23 Encounter for immunization: Secondary | ICD-10-CM | POA: Diagnosis not present

## 2016-03-02 DIAGNOSIS — M25561 Pain in right knee: Secondary | ICD-10-CM | POA: Diagnosis not present

## 2016-03-02 DIAGNOSIS — M1711 Unilateral primary osteoarthritis, right knee: Secondary | ICD-10-CM | POA: Diagnosis not present

## 2016-03-04 DIAGNOSIS — M1712 Unilateral primary osteoarthritis, left knee: Secondary | ICD-10-CM | POA: Diagnosis not present

## 2016-03-04 DIAGNOSIS — M25562 Pain in left knee: Secondary | ICD-10-CM | POA: Diagnosis not present

## 2016-03-08 DIAGNOSIS — M25561 Pain in right knee: Secondary | ICD-10-CM | POA: Diagnosis not present

## 2016-03-08 DIAGNOSIS — M1711 Unilateral primary osteoarthritis, right knee: Secondary | ICD-10-CM | POA: Diagnosis not present

## 2016-03-09 ENCOUNTER — Ambulatory Visit: Payer: Commercial Managed Care - HMO

## 2016-03-11 DIAGNOSIS — M1712 Unilateral primary osteoarthritis, left knee: Secondary | ICD-10-CM | POA: Diagnosis not present

## 2016-03-11 DIAGNOSIS — M25562 Pain in left knee: Secondary | ICD-10-CM | POA: Diagnosis not present

## 2016-03-15 DIAGNOSIS — M25562 Pain in left knee: Secondary | ICD-10-CM | POA: Diagnosis not present

## 2016-03-15 DIAGNOSIS — M170 Bilateral primary osteoarthritis of knee: Secondary | ICD-10-CM | POA: Diagnosis not present

## 2016-03-15 DIAGNOSIS — M25561 Pain in right knee: Secondary | ICD-10-CM | POA: Diagnosis not present

## 2016-03-23 ENCOUNTER — Ambulatory Visit
Admission: RE | Admit: 2016-03-23 | Discharge: 2016-03-23 | Disposition: A | Discharge status: Home / Self Care | Payer: Commercial Managed Care - HMO | Source: Ambulatory Visit | Attending: Family Medicine | Admitting: Family Medicine

## 2016-03-23 DIAGNOSIS — Z1231 Encounter for screening mammogram for malignant neoplasm of breast: Secondary | ICD-10-CM | POA: Diagnosis not present

## 2016-04-26 DIAGNOSIS — E7800 Pure hypercholesterolemia, unspecified: Secondary | ICD-10-CM | POA: Diagnosis not present

## 2016-04-26 DIAGNOSIS — F39 Unspecified mood [affective] disorder: Secondary | ICD-10-CM | POA: Diagnosis not present

## 2016-04-26 DIAGNOSIS — M150 Primary generalized (osteo)arthritis: Secondary | ICD-10-CM | POA: Diagnosis not present

## 2016-04-26 DIAGNOSIS — Z0000 Encounter for general adult medical examination without abnormal findings: Secondary | ICD-10-CM | POA: Diagnosis not present

## 2016-04-26 DIAGNOSIS — M4804 Spinal stenosis, thoracic region: Secondary | ICD-10-CM | POA: Diagnosis not present

## 2016-04-26 DIAGNOSIS — G44209 Tension-type headache, unspecified, not intractable: Secondary | ICD-10-CM | POA: Diagnosis not present

## 2016-04-26 DIAGNOSIS — I10 Essential (primary) hypertension: Secondary | ICD-10-CM | POA: Diagnosis not present

## 2016-04-26 DIAGNOSIS — M859 Disorder of bone density and structure, unspecified: Secondary | ICD-10-CM | POA: Diagnosis not present

## 2016-04-26 DIAGNOSIS — K5732 Diverticulitis of large intestine without perforation or abscess without bleeding: Secondary | ICD-10-CM | POA: Diagnosis not present

## 2016-07-12 DIAGNOSIS — H18001 Unspecified corneal deposit, right eye: Secondary | ICD-10-CM | POA: Diagnosis not present

## 2016-07-12 DIAGNOSIS — H52203 Unspecified astigmatism, bilateral: Secondary | ICD-10-CM | POA: Diagnosis not present

## 2016-07-12 DIAGNOSIS — H10411 Chronic giant papillary conjunctivitis, right eye: Secondary | ICD-10-CM | POA: Diagnosis not present

## 2016-07-12 DIAGNOSIS — H1789 Other corneal scars and opacities: Secondary | ICD-10-CM | POA: Diagnosis not present

## 2016-09-14 DIAGNOSIS — M25562 Pain in left knee: Secondary | ICD-10-CM | POA: Diagnosis not present

## 2016-09-14 DIAGNOSIS — M170 Bilateral primary osteoarthritis of knee: Secondary | ICD-10-CM | POA: Diagnosis not present

## 2016-09-14 DIAGNOSIS — M1711 Unilateral primary osteoarthritis, right knee: Secondary | ICD-10-CM | POA: Diagnosis not present

## 2016-09-14 DIAGNOSIS — M25561 Pain in right knee: Secondary | ICD-10-CM | POA: Diagnosis not present

## 2016-09-17 DIAGNOSIS — H18421 Band keratopathy, right eye: Secondary | ICD-10-CM | POA: Diagnosis not present

## 2016-09-30 DIAGNOSIS — M1711 Unilateral primary osteoarthritis, right knee: Secondary | ICD-10-CM | POA: Diagnosis not present

## 2016-09-30 DIAGNOSIS — M25561 Pain in right knee: Secondary | ICD-10-CM | POA: Diagnosis not present

## 2016-10-04 DIAGNOSIS — H16401 Unspecified corneal neovascularization, right eye: Secondary | ICD-10-CM | POA: Diagnosis not present

## 2016-10-04 DIAGNOSIS — H1789 Other corneal scars and opacities: Secondary | ICD-10-CM | POA: Diagnosis not present

## 2016-10-06 DIAGNOSIS — M1711 Unilateral primary osteoarthritis, right knee: Secondary | ICD-10-CM | POA: Diagnosis not present

## 2016-10-06 DIAGNOSIS — M25561 Pain in right knee: Secondary | ICD-10-CM | POA: Diagnosis not present

## 2016-10-13 DIAGNOSIS — M1711 Unilateral primary osteoarthritis, right knee: Secondary | ICD-10-CM | POA: Diagnosis not present

## 2016-10-13 DIAGNOSIS — M25561 Pain in right knee: Secondary | ICD-10-CM | POA: Diagnosis not present

## 2016-10-20 DIAGNOSIS — M25561 Pain in right knee: Secondary | ICD-10-CM | POA: Diagnosis not present

## 2016-10-20 DIAGNOSIS — M1711 Unilateral primary osteoarthritis, right knee: Secondary | ICD-10-CM | POA: Diagnosis not present

## 2016-11-01 DIAGNOSIS — M150 Primary generalized (osteo)arthritis: Secondary | ICD-10-CM | POA: Diagnosis not present

## 2016-11-01 DIAGNOSIS — R079 Chest pain, unspecified: Secondary | ICD-10-CM | POA: Diagnosis not present

## 2016-11-01 DIAGNOSIS — I10 Essential (primary) hypertension: Secondary | ICD-10-CM | POA: Diagnosis not present

## 2016-12-22 DIAGNOSIS — R102 Pelvic and perineal pain: Secondary | ICD-10-CM | POA: Diagnosis not present

## 2016-12-22 DIAGNOSIS — M150 Primary generalized (osteo)arthritis: Secondary | ICD-10-CM | POA: Diagnosis not present

## 2016-12-22 DIAGNOSIS — I10 Essential (primary) hypertension: Secondary | ICD-10-CM | POA: Diagnosis not present

## 2016-12-22 DIAGNOSIS — M4804 Spinal stenosis, thoracic region: Secondary | ICD-10-CM | POA: Diagnosis not present

## 2017-02-15 ENCOUNTER — Other Ambulatory Visit: Payer: Self-pay | Admitting: Family Medicine

## 2017-02-15 DIAGNOSIS — Z1231 Encounter for screening mammogram for malignant neoplasm of breast: Secondary | ICD-10-CM

## 2017-03-01 DIAGNOSIS — Z23 Encounter for immunization: Secondary | ICD-10-CM | POA: Diagnosis not present

## 2017-03-29 ENCOUNTER — Ambulatory Visit
Admission: RE | Admit: 2017-03-29 | Discharge: 2017-03-29 | Disposition: A | Discharge status: Home / Self Care | Payer: Commercial Managed Care - HMO | Source: Ambulatory Visit | Attending: Family Medicine | Admitting: Family Medicine

## 2017-03-29 DIAGNOSIS — Z1231 Encounter for screening mammogram for malignant neoplasm of breast: Secondary | ICD-10-CM

## 2017-05-11 DIAGNOSIS — R300 Dysuria: Secondary | ICD-10-CM | POA: Diagnosis not present

## 2017-06-06 DIAGNOSIS — M150 Primary generalized (osteo)arthritis: Secondary | ICD-10-CM | POA: Diagnosis not present

## 2017-06-06 DIAGNOSIS — M859 Disorder of bone density and structure, unspecified: Secondary | ICD-10-CM | POA: Diagnosis not present

## 2017-06-06 DIAGNOSIS — I10 Essential (primary) hypertension: Secondary | ICD-10-CM | POA: Diagnosis not present

## 2017-06-06 DIAGNOSIS — G44209 Tension-type headache, unspecified, not intractable: Secondary | ICD-10-CM | POA: Diagnosis not present

## 2017-06-06 DIAGNOSIS — F39 Unspecified mood [affective] disorder: Secondary | ICD-10-CM | POA: Diagnosis not present

## 2017-06-06 DIAGNOSIS — E7800 Pure hypercholesterolemia, unspecified: Secondary | ICD-10-CM | POA: Diagnosis not present

## 2017-06-06 DIAGNOSIS — Z1389 Encounter for screening for other disorder: Secondary | ICD-10-CM | POA: Diagnosis not present

## 2017-06-06 DIAGNOSIS — M4804 Spinal stenosis, thoracic region: Secondary | ICD-10-CM | POA: Diagnosis not present

## 2017-06-06 DIAGNOSIS — Z0000 Encounter for general adult medical examination without abnormal findings: Secondary | ICD-10-CM | POA: Diagnosis not present

## 2017-06-06 DIAGNOSIS — G479 Sleep disorder, unspecified: Secondary | ICD-10-CM | POA: Diagnosis not present

## 2017-07-07 DIAGNOSIS — Z1211 Encounter for screening for malignant neoplasm of colon: Secondary | ICD-10-CM | POA: Diagnosis not present

## 2017-10-19 DIAGNOSIS — G894 Chronic pain syndrome: Secondary | ICD-10-CM | POA: Diagnosis not present

## 2017-10-19 DIAGNOSIS — M4804 Spinal stenosis, thoracic region: Secondary | ICD-10-CM | POA: Diagnosis not present

## 2017-10-19 DIAGNOSIS — L03119 Cellulitis of unspecified part of limb: Secondary | ICD-10-CM | POA: Diagnosis not present

## 2017-11-17 ENCOUNTER — Other Ambulatory Visit: Payer: Self-pay | Admitting: Podiatry

## 2017-11-17 ENCOUNTER — Encounter: Payer: Self-pay | Admitting: Podiatry

## 2017-11-17 ENCOUNTER — Encounter

## 2017-11-17 ENCOUNTER — Ambulatory Visit: Payer: Commercial Managed Care - HMO | Admitting: Podiatry

## 2017-11-17 ENCOUNTER — Ambulatory Visit (INDEPENDENT_AMBULATORY_CARE_PROVIDER_SITE_OTHER): Payer: Medicare HMO

## 2017-11-17 DIAGNOSIS — M2041 Other hammer toe(s) (acquired), right foot: Secondary | ICD-10-CM

## 2017-11-17 DIAGNOSIS — M79674 Pain in right toe(s): Secondary | ICD-10-CM

## 2017-11-17 DIAGNOSIS — M21611 Bunion of right foot: Secondary | ICD-10-CM | POA: Diagnosis not present

## 2017-11-20 NOTE — Progress Notes (Signed)
  Subjective:  Patient ID: Pamela Robinson, female    DOB: 16-May-1933,  MRN: [id number]  Chief Complaint  Patient presents with  . Foot Ulcer    right second toe abscess/ulcer - non diabetic    84 y.o. female presents with the above complaint.  Reports history of ulcer to the second toe.  States that she was given Cipro by her PCP and look better prior to this the toe was red and swollen with concerns for infection.  Past Medical History:  Diagnosis Date  . Diverticulitis   . Hyperlipidemia   . Hypertension    Past Surgical History:  Procedure Laterality Date  . ABDOMINAL HYSTERECTOMY    . APPENDECTOMY    . BLADDER SUSPENSION    . TONSILLECTOMY      Current Outpatient Medications:  .  atenolol (TENORMIN) 50 MG tablet, Take 50 mg by mouth 2 (two) times daily., Disp: , Rfl: 12 .  cephALEXin (KEFLEX) 500 MG capsule, 1 CAPSULE EVERY 12 HRS ORALLY 7 DAY(S), Disp: , Rfl: 0 .  ciprofloxacin (CIPRO) 500 MG tablet, Take 1 tablet (500 mg total) by mouth every 12 (twelve) hours., Disp: 14 tablet, Rfl: 0 .  irbesartan-hydrochlorothiazide (AVALIDE) 300-12.5 MG tablet, Take 1 tablet by mouth daily., Disp: , Rfl: 9 .  metroNIDAZOLE (FLAGYL) 500 MG tablet, Take 1 tablet (500 mg total) by mouth 2 (two) times daily., Disp: 14 tablet, Rfl: 0 .  mirtazapine (REMERON) 15 MG tablet, 3 TABLETS AT BEDTIME ORALLY 90 DAYS, Disp: , Rfl: 1 .  ondansetron (ZOFRAN) 4 MG tablet, Take 1 tablet (4 mg total) by mouth every 6 (six) hours., Disp: 12 tablet, Rfl: 0 .  prednisoLONE acetate (PRED FORTE) 1 % ophthalmic suspension, INSTILL 1 DROP INTO RIGHT EYE EVERY OTHER DAY, Disp: , Rfl: 3 .  simvastatin (ZOCOR) 40 MG tablet, TAKE 1 TABLET BY MOUTH EVERY EVENING ONCE A DAY, Disp: , Rfl: 5  Allergies  Allergen Reactions  . Shellfish Allergy Anaphylaxis  . Codeine Nausea And Vomiting  . Sulfa Antibiotics Nausea And Vomiting   Review of Systems: Negative except as noted in the HPI. Denies N/V/F/Ch. Objective:    There were no vitals filed for this visit. General AA&O x3. Normal mood and affect.  Vascular Dorsalis pedis and posterior tibial pulses  present 2+ bilaterally  Capillary refill normal to all digits. Pedal hair growth normal.  Neurologic Epicritic sensation grossly present.  Dermatologic No open lesions. Interspaces clear of maceration. Nails well groomed and normal in appearance.  Orthopedic: MMT 5/5 in dorsiflexion, plantarflexion, inversion, and eversion. Normal joint ROM without pain or crepitus.   Assessment & Plan:  Patient was evaluated and treated and all questions answered.  Hammertoe right second toe -X-rays taken reviewed no u dispense nderlying erosion -Dispensed corn cushion -Follow-up in 6 weeks for recheck to make sure the area does not further ulcerate  Return in about 6 weeks (around 12/29/2017) for Hammertoe f/u .

## 2018-01-31 DIAGNOSIS — N3090 Cystitis, unspecified without hematuria: Secondary | ICD-10-CM | POA: Diagnosis not present

## 2018-01-31 DIAGNOSIS — R8290 Unspecified abnormal findings in urine: Secondary | ICD-10-CM | POA: Diagnosis not present

## 2018-01-31 DIAGNOSIS — L239 Allergic contact dermatitis, unspecified cause: Secondary | ICD-10-CM | POA: Diagnosis not present

## 2018-01-31 DIAGNOSIS — R300 Dysuria: Secondary | ICD-10-CM | POA: Diagnosis not present

## 2018-02-15 DIAGNOSIS — Z23 Encounter for immunization: Secondary | ICD-10-CM | POA: Diagnosis not present

## 2018-02-17 ENCOUNTER — Other Ambulatory Visit: Payer: Self-pay | Admitting: Family Medicine

## 2018-02-17 DIAGNOSIS — Z1231 Encounter for screening mammogram for malignant neoplasm of breast: Secondary | ICD-10-CM

## 2018-02-24 DIAGNOSIS — R8290 Unspecified abnormal findings in urine: Secondary | ICD-10-CM | POA: Diagnosis not present

## 2018-02-27 DIAGNOSIS — R8290 Unspecified abnormal findings in urine: Secondary | ICD-10-CM | POA: Diagnosis not present

## 2018-04-03 ENCOUNTER — Ambulatory Visit
Admission: RE | Admit: 2018-04-03 | Discharge: 2018-04-03 | Disposition: A | Discharge status: Home / Self Care | Payer: Medicare HMO | Source: Ambulatory Visit | Attending: Family Medicine | Admitting: Family Medicine

## 2018-04-03 DIAGNOSIS — Z1231 Encounter for screening mammogram for malignant neoplasm of breast: Secondary | ICD-10-CM | POA: Diagnosis not present

## 2018-05-23 ENCOUNTER — Other Ambulatory Visit: Payer: Self-pay | Admitting: Dermatology

## 2018-05-23 DIAGNOSIS — C44319 Basal cell carcinoma of skin of other parts of face: Secondary | ICD-10-CM | POA: Diagnosis not present

## 2018-10-11 DIAGNOSIS — I10 Essential (primary) hypertension: Secondary | ICD-10-CM | POA: Diagnosis not present

## 2018-10-11 DIAGNOSIS — R300 Dysuria: Secondary | ICD-10-CM | POA: Diagnosis not present

## 2018-10-11 DIAGNOSIS — G479 Sleep disorder, unspecified: Secondary | ICD-10-CM | POA: Diagnosis not present

## 2018-10-11 DIAGNOSIS — F39 Unspecified mood [affective] disorder: Secondary | ICD-10-CM | POA: Diagnosis not present

## 2018-10-11 DIAGNOSIS — N3090 Cystitis, unspecified without hematuria: Secondary | ICD-10-CM | POA: Diagnosis not present

## 2019-01-25 ENCOUNTER — Other Ambulatory Visit: Payer: Self-pay | Admitting: Family Medicine

## 2019-01-25 DIAGNOSIS — R109 Unspecified abdominal pain: Secondary | ICD-10-CM

## 2019-01-30 ENCOUNTER — Ambulatory Visit
Admission: RE | Admit: 2019-01-30 | Discharge: 2019-01-30 | Disposition: A | Discharge status: Home / Self Care | Payer: Medicare HMO | Source: Ambulatory Visit | Attending: Family Medicine | Admitting: Family Medicine

## 2019-01-30 DIAGNOSIS — R109 Unspecified abdominal pain: Secondary | ICD-10-CM

## 2019-01-30 DIAGNOSIS — K760 Fatty (change of) liver, not elsewhere classified: Secondary | ICD-10-CM | POA: Diagnosis not present

## 2019-02-21 ENCOUNTER — Other Ambulatory Visit: Payer: Self-pay | Admitting: Family Medicine

## 2019-02-21 DIAGNOSIS — Z1231 Encounter for screening mammogram for malignant neoplasm of breast: Secondary | ICD-10-CM

## 2019-02-24 DIAGNOSIS — Z23 Encounter for immunization: Secondary | ICD-10-CM | POA: Diagnosis not present

## 2019-03-12 DIAGNOSIS — I10 Essential (primary) hypertension: Secondary | ICD-10-CM | POA: Diagnosis not present

## 2019-03-12 DIAGNOSIS — M545 Low back pain: Secondary | ICD-10-CM | POA: Diagnosis not present

## 2019-03-12 DIAGNOSIS — N3090 Cystitis, unspecified without hematuria: Secondary | ICD-10-CM | POA: Diagnosis not present

## 2019-03-12 DIAGNOSIS — E7800 Pure hypercholesterolemia, unspecified: Secondary | ICD-10-CM | POA: Diagnosis not present

## 2019-03-19 DIAGNOSIS — H52203 Unspecified astigmatism, bilateral: Secondary | ICD-10-CM | POA: Diagnosis not present

## 2019-03-19 DIAGNOSIS — H524 Presbyopia: Secondary | ICD-10-CM | POA: Diagnosis not present

## 2019-03-19 DIAGNOSIS — H1789 Other corneal scars and opacities: Secondary | ICD-10-CM | POA: Diagnosis not present

## 2019-04-09 ENCOUNTER — Ambulatory Visit
Admission: RE | Admit: 2019-04-09 | Discharge: 2019-04-09 | Disposition: A | Discharge status: Home / Self Care | Payer: Medicare HMO | Source: Ambulatory Visit | Attending: Family Medicine | Admitting: Family Medicine

## 2019-04-09 ENCOUNTER — Other Ambulatory Visit: Payer: Self-pay

## 2019-04-09 DIAGNOSIS — Z1231 Encounter for screening mammogram for malignant neoplasm of breast: Secondary | ICD-10-CM

## 2019-04-11 DIAGNOSIS — Z20828 Contact with and (suspected) exposure to other viral communicable diseases: Secondary | ICD-10-CM | POA: Diagnosis not present

## 2019-05-07 DIAGNOSIS — Z1211 Encounter for screening for malignant neoplasm of colon: Secondary | ICD-10-CM | POA: Diagnosis not present

## 2019-06-15 ENCOUNTER — Emergency Department (HOSPITAL_COMMUNITY)
Admission: EM | Admit: 2019-06-15 | Discharge: 2019-06-15 | Disposition: A | Discharge status: Home / Self Care | Payer: Medicare HMO | Attending: Emergency Medicine | Admitting: Emergency Medicine

## 2019-06-15 ENCOUNTER — Emergency Department (HOSPITAL_COMMUNITY): Payer: Medicare HMO

## 2019-06-15 ENCOUNTER — Other Ambulatory Visit: Payer: Self-pay

## 2019-06-15 ENCOUNTER — Encounter (HOSPITAL_COMMUNITY): Payer: Self-pay | Admitting: Pharmacy Technician

## 2019-06-15 DIAGNOSIS — M79601 Pain in right arm: Secondary | ICD-10-CM | POA: Insufficient documentation

## 2019-06-15 DIAGNOSIS — S0990XA Unspecified injury of head, initial encounter: Secondary | ICD-10-CM | POA: Diagnosis not present

## 2019-06-15 DIAGNOSIS — M25551 Pain in right hip: Secondary | ICD-10-CM | POA: Diagnosis not present

## 2019-06-15 DIAGNOSIS — R519 Headache, unspecified: Secondary | ICD-10-CM | POA: Diagnosis not present

## 2019-06-15 DIAGNOSIS — Z79899 Other long term (current) drug therapy: Secondary | ICD-10-CM | POA: Insufficient documentation

## 2019-06-15 DIAGNOSIS — I10 Essential (primary) hypertension: Secondary | ICD-10-CM

## 2019-06-15 DIAGNOSIS — Y998 Other external cause status: Secondary | ICD-10-CM | POA: Diagnosis not present

## 2019-06-15 DIAGNOSIS — Y9389 Activity, other specified: Secondary | ICD-10-CM | POA: Diagnosis not present

## 2019-06-15 DIAGNOSIS — S79911A Unspecified injury of right hip, initial encounter: Secondary | ICD-10-CM | POA: Diagnosis not present

## 2019-06-15 DIAGNOSIS — S299XXA Unspecified injury of thorax, initial encounter: Secondary | ICD-10-CM | POA: Diagnosis not present

## 2019-06-15 DIAGNOSIS — Y92410 Unspecified street and highway as the place of occurrence of the external cause: Secondary | ICD-10-CM | POA: Diagnosis not present

## 2019-06-15 DIAGNOSIS — R52 Pain, unspecified: Secondary | ICD-10-CM

## 2019-06-15 LAB — CBC
HCT: 40.6 % (ref 36.0–46.0)
Hemoglobin: 13.9 g/dL (ref 12.0–15.0)
MCH: 31.7 pg (ref 26.0–34.0)
MCHC: 34.2 g/dL (ref 30.0–36.0)
MCV: 92.5 fL (ref 80.0–100.0)
Platelets: 199 10*3/uL (ref 150–400)
RBC: 4.39 MIL/uL (ref 3.87–5.11)
RDW: 12.1 % (ref 11.5–15.5)
WBC: 8.5 10*3/uL (ref 4.0–10.5)
nRBC: 0 % (ref 0.0–0.2)

## 2019-06-15 LAB — BASIC METABOLIC PANEL
Anion gap: 11 (ref 5–15)
BUN: 12 mg/dL (ref 8–23)
CO2: 28 mmol/L (ref 22–32)
Calcium: 9.6 mg/dL (ref 8.9–10.3)
Chloride: 99 mmol/L (ref 98–111)
Creatinine, Ser: 0.73 mg/dL (ref 0.44–1.00)
GFR calc Af Amer: >60 mL/min (ref >60)
GFR calc non Af Amer: >60 mL/min (ref >60)
Glucose, Bld: 101 mg/dL — ABNORMAL HIGH (ref 70–99)
Potassium: 3.3 mmol/L — ABNORMAL LOW (ref 3.5–5.1)
Sodium: 138 mmol/L (ref 135–145)

## 2019-06-15 MED ORDER — AMLODIPINE BESYLATE 5 MG PO TABS: 5.0000 mg | ORAL_TABLET | Freq: Once | ORAL | Status: DC
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
## 2019-06-15 NOTE — Discharge Instructions (Addendum)
Expect to be stiff and sore for the next few days, take tylenol as needed for pain, follow up with your primary care doctor to check on your high blood pressure.  Please review the discharge instructions for additional information

## 2019-09-10 DIAGNOSIS — R05 Cough: Secondary | ICD-10-CM | POA: Diagnosis not present

## 2019-09-11 DIAGNOSIS — R05 Cough: Secondary | ICD-10-CM | POA: Diagnosis not present

## 2019-09-11 DIAGNOSIS — Z1152 Encounter for screening for COVID-19: Secondary | ICD-10-CM | POA: Diagnosis not present

## 2019-09-25 ENCOUNTER — Ambulatory Visit
Admission: RE | Admit: 2019-09-25 | Discharge: 2019-09-25 | Disposition: A | Discharge status: Home / Self Care | Payer: Medicare HMO | Source: Ambulatory Visit | Attending: Family Medicine | Admitting: Family Medicine

## 2019-09-25 ENCOUNTER — Other Ambulatory Visit: Payer: Self-pay | Admitting: Family Medicine

## 2019-09-25 DIAGNOSIS — J40 Bronchitis, not specified as acute or chronic: Secondary | ICD-10-CM | POA: Diagnosis not present

## 2019-09-25 DIAGNOSIS — M4804 Spinal stenosis, thoracic region: Secondary | ICD-10-CM | POA: Diagnosis not present

## 2019-09-25 DIAGNOSIS — R05 Cough: Secondary | ICD-10-CM | POA: Diagnosis not present

## 2019-09-25 DIAGNOSIS — S3992XD Unspecified injury of lower back, subsequent encounter: Secondary | ICD-10-CM | POA: Diagnosis not present

## 2019-11-02 DIAGNOSIS — G44209 Tension-type headache, unspecified, not intractable: Secondary | ICD-10-CM | POA: Diagnosis not present

## 2019-11-02 DIAGNOSIS — G479 Sleep disorder, unspecified: Secondary | ICD-10-CM | POA: Diagnosis not present

## 2019-11-02 DIAGNOSIS — Z0000 Encounter for general adult medical examination without abnormal findings: Secondary | ICD-10-CM | POA: Diagnosis not present

## 2019-11-02 DIAGNOSIS — M150 Primary generalized (osteo)arthritis: Secondary | ICD-10-CM | POA: Diagnosis not present

## 2019-11-02 DIAGNOSIS — E7800 Pure hypercholesterolemia, unspecified: Secondary | ICD-10-CM | POA: Diagnosis not present

## 2019-11-02 DIAGNOSIS — M859 Disorder of bone density and structure, unspecified: Secondary | ICD-10-CM | POA: Diagnosis not present

## 2019-11-02 DIAGNOSIS — F39 Unspecified mood [affective] disorder: Secondary | ICD-10-CM | POA: Diagnosis not present

## 2019-11-02 DIAGNOSIS — N3090 Cystitis, unspecified without hematuria: Secondary | ICD-10-CM | POA: Diagnosis not present

## 2019-11-02 DIAGNOSIS — K5732 Diverticulitis of large intestine without perforation or abscess without bleeding: Secondary | ICD-10-CM | POA: Diagnosis not present

## 2019-11-02 DIAGNOSIS — I10 Essential (primary) hypertension: Secondary | ICD-10-CM | POA: Diagnosis not present

## 2019-11-02 DIAGNOSIS — M4804 Spinal stenosis, thoracic region: Secondary | ICD-10-CM | POA: Diagnosis not present

## 2019-11-02 DIAGNOSIS — Z1389 Encounter for screening for other disorder: Secondary | ICD-10-CM | POA: Diagnosis not present

## 2019-12-10 ENCOUNTER — Ambulatory Visit: Payer: Medicare HMO | Admitting: Dermatology

## 2019-12-10 ENCOUNTER — Other Ambulatory Visit: Payer: Self-pay

## 2019-12-10 DIAGNOSIS — D0430 Carcinoma in situ of skin of unspecified part of face: Secondary | ICD-10-CM

## 2019-12-10 DIAGNOSIS — C4491 Basal cell carcinoma of skin, unspecified: Secondary | ICD-10-CM

## 2019-12-10 DIAGNOSIS — D485 Neoplasm of uncertain behavior of skin: Secondary | ICD-10-CM

## 2019-12-10 DIAGNOSIS — C44319 Basal cell carcinoma of skin of other parts of face: Secondary | ICD-10-CM | POA: Diagnosis not present

## 2019-12-10 DIAGNOSIS — D0439 Carcinoma in situ of skin of other parts of face: Secondary | ICD-10-CM | POA: Diagnosis not present

## 2019-12-10 HISTORY — DX: Basal cell carcinoma of skin, unspecified: C44.91

## 2019-12-10 NOTE — Patient Instructions (Addendum)
Biopsy, Surgery (Curettage) & Surgery (Excision) Aftercare Instructions  1. Okay to remove bandage in 24 hours  2. Wash area with soap and water  3. Apply Vaseline to area twice daily until healed (Not Neosporin)  4. Okay to cover with a Band-Aid to decrease the chance of infection or prevent irritation from clothing; also it's okay to uncover lesion at home.  5. Suture instructions: return to our office in 7-10 or 10-14 days for a nurse visit for suture removal. Variable healing with sutures, if pain or itching occurs call our office. It's okay to shower or bathe 24 hours after sutures are given.  6. The following risks may occur after a biopsy, curettage or excision: bleeding, scarring, discoloration, recurrence, infection (redness, yellow drainage, pain or swelling).  7. For questions, concerns and results call our office at [place] before 4pm & Friday before 3pm. Biopsy results will be available in 1 week. Routine follow-up for Pamela Robinson date of birth [birth year].  The biopsy-proven basal cell skin cancer on her right cheek never healed after the biopsy.  Repeat shave biopsy taken and if it shows there is still basal cell there, we will choose to have Dr. Link Snuffer do the removal.  She is well aware that there also typical skin cancers below the left eye and two larger spots on the left back cheek.  These are currently not bothering her and she would like nothing done at the moment.  [name] knows that she can call my office on Wednesday or Thursday this week to review the results and to arrange appropriate care.

## 2019-12-12 ENCOUNTER — Telehealth: Payer: Self-pay | Admitting: *Deleted

## 2019-12-12 NOTE — Telephone Encounter (Signed)
-----   Message from [name], MD sent at 12/12/2019  5:09 AM EDT ----- Recurrent lesion, Mohs with Dr. Link Snuffer

## 2019-12-12 NOTE — Telephone Encounter (Signed)
Left message for patient to call us back with results.

## 2019-12-15 ENCOUNTER — Encounter: Payer: Self-pay | Admitting: Dermatology

## 2019-12-15 NOTE — Progress Notes (Signed)
   Follow-Up Visit   Subjective  Pamela Robinson is a 86 y.o. female who presents for the following: Skin Problem (spot on face removed about a year ago and still having trouble healing).  Both Location: Right cheek Duration:  Quality:  Associated Signs/Symptoms: Modifying Factors:  Severity:  Timing: Context: Previous biopsy showed basal cell skin cancer than 2 years ago  Objective  Well appearing patient in no apparent distress; mood and affect are within normal limits.  A focused examination was performed including Head and neck.. Relevant physical exam findings are noted in the Assessment and Plan.   Assessment & Plan    Neoplasm of uncertain behavior of skin Right Malar Cheek  Skin / nail biopsy Type of biopsy: tangential   Informed consent: discussed and consent obtained   Timeout: patient name, date of birth, surgical site, and procedure verified   Procedure prep:  Patient was prepped and draped in usual sterile fashion Prep type:  Chlorhexidine Anesthesia: the lesion was anesthetized in a standard fashion   Anesthetic:  1% lidocaine w/ epinephrine 1-100,000 local infiltration Instrument used: flexible razor blade   Hemostasis achieved with: ferric subsulfate   Outcome: patient tolerated procedure well   Post-procedure details: wound care instructions given    Specimen 1 - Surgical pathology Differential Diagnosis:scc VS bcc Check Margins: No  If biopsy confirms residual basal cell carcinoma, patient which use to have Mohs surgery done by Dr. Link Snuffer.  Carcinoma in situ of skin of face, unspecified location Left Buccal Cheek   These spots were pointed out to Pamela Robinson and she was told there are likely more skin cancers.  For now, she prefers no intervention.     I, Pamela Monarch, MD, have reviewed all documentation for this visit.  The documentation on 12/15/19 for the exam, diagnosis, procedures, and orders are all accurate and complete.

## 2019-12-18 ENCOUNTER — Encounter: Payer: Self-pay | Admitting: *Deleted

## 2019-12-18 NOTE — Telephone Encounter (Signed)
-----   Message from [name], MD sent at 12/12/2019  5:09 AM EDT ----- Recurrent lesion, Mohs with Dr. Link Snuffer

## 2019-12-18 NOTE — Telephone Encounter (Signed)
Left message for patient to call.

## 2019-12-18 NOTE — Telephone Encounter (Signed)
LEFT PATIENT A MESSAGE TO CALL OFFICE STARTED REFERRAL.

## 2019-12-18 NOTE — Telephone Encounter (Signed)
Path to patient and she is aware referral was made.

## 2020-03-01 DIAGNOSIS — Z23 Encounter for immunization: Secondary | ICD-10-CM | POA: Diagnosis not present

## 2020-03-03 ENCOUNTER — Other Ambulatory Visit: Payer: Self-pay | Admitting: Family Medicine

## 2020-03-03 DIAGNOSIS — Z1231 Encounter for screening mammogram for malignant neoplasm of breast: Secondary | ICD-10-CM

## 2020-03-24 ENCOUNTER — Other Ambulatory Visit: Payer: Self-pay | Admitting: Family Medicine

## 2020-03-24 DIAGNOSIS — Z1231 Encounter for screening mammogram for malignant neoplasm of breast: Secondary | ICD-10-CM

## 2020-04-29 DIAGNOSIS — Z20828 Contact with and (suspected) exposure to other viral communicable diseases: Secondary | ICD-10-CM | POA: Diagnosis not present

## 2020-05-05 ENCOUNTER — Ambulatory Visit: Payer: Medicare HMO

## 2020-06-25 ENCOUNTER — Ambulatory Visit: Payer: Medicare HMO

## 2020-07-22 DIAGNOSIS — M545 Low back pain, unspecified: Secondary | ICD-10-CM | POA: Insufficient documentation

## 2020-07-22 DIAGNOSIS — M4856XA Collapsed vertebra, not elsewhere classified, lumbar region, initial encounter for fracture: Secondary | ICD-10-CM | POA: Diagnosis not present

## 2020-07-22 DIAGNOSIS — M5136 Other intervertebral disc degeneration, lumbar region: Secondary | ICD-10-CM | POA: Diagnosis not present

## 2020-08-07 ENCOUNTER — Inpatient Hospital Stay: Admission: RE | Admit: 2020-08-07 | Payer: Medicare HMO | Source: Ambulatory Visit

## 2020-08-26 DIAGNOSIS — M5416 Radiculopathy, lumbar region: Secondary | ICD-10-CM | POA: Diagnosis not present

## 2020-09-18 DIAGNOSIS — M5416 Radiculopathy, lumbar region: Secondary | ICD-10-CM | POA: Diagnosis not present

## 2020-09-29 ENCOUNTER — Ambulatory Visit: Payer: Medicare HMO

## 2020-10-02 DIAGNOSIS — M5459 Other low back pain: Secondary | ICD-10-CM | POA: Diagnosis not present

## 2020-10-14 DIAGNOSIS — E7800 Pure hypercholesterolemia, unspecified: Secondary | ICD-10-CM | POA: Diagnosis not present

## 2020-10-14 DIAGNOSIS — I10 Essential (primary) hypertension: Secondary | ICD-10-CM | POA: Diagnosis not present

## 2020-10-14 DIAGNOSIS — M4804 Spinal stenosis, thoracic region: Secondary | ICD-10-CM | POA: Diagnosis not present

## 2020-10-28 DIAGNOSIS — M545 Low back pain, unspecified: Secondary | ICD-10-CM | POA: Diagnosis not present

## 2020-11-06 DIAGNOSIS — M961 Postlaminectomy syndrome, not elsewhere classified: Secondary | ICD-10-CM | POA: Insufficient documentation

## 2020-11-06 DIAGNOSIS — M51369 Other intervertebral disc degeneration, lumbar region without mention of lumbar back pain or lower extremity pain: Secondary | ICD-10-CM | POA: Insufficient documentation

## 2020-11-06 DIAGNOSIS — M5136 Other intervertebral disc degeneration, lumbar region: Secondary | ICD-10-CM | POA: Diagnosis not present

## 2020-11-07 DIAGNOSIS — Z800 Family history of malignant neoplasm of digestive organs: Secondary | ICD-10-CM | POA: Diagnosis not present

## 2020-11-17 ENCOUNTER — Ambulatory Visit
Admission: RE | Admit: 2020-11-17 | Discharge: 2020-11-17 | Disposition: A | Discharge status: Home / Self Care | Payer: Medicare HMO | Source: Ambulatory Visit | Attending: Family Medicine | Admitting: Family Medicine

## 2020-11-17 ENCOUNTER — Other Ambulatory Visit: Payer: Self-pay

## 2020-11-17 DIAGNOSIS — Z1231 Encounter for screening mammogram for malignant neoplasm of breast: Secondary | ICD-10-CM | POA: Diagnosis not present

## 2020-12-01 ENCOUNTER — Ambulatory Visit: Payer: Medicare HMO | Admitting: Dermatology

## 2020-12-16 DIAGNOSIS — M961 Postlaminectomy syndrome, not elsewhere classified: Secondary | ICD-10-CM | POA: Diagnosis not present

## 2020-12-16 DIAGNOSIS — M5136 Other intervertebral disc degeneration, lumbar region: Secondary | ICD-10-CM | POA: Diagnosis not present

## 2020-12-16 DIAGNOSIS — M48062 Spinal stenosis, lumbar region with neurogenic claudication: Secondary | ICD-10-CM | POA: Diagnosis not present

## 2020-12-16 DIAGNOSIS — M5416 Radiculopathy, lumbar region: Secondary | ICD-10-CM | POA: Diagnosis not present

## 2020-12-16 DIAGNOSIS — M48061 Spinal stenosis, lumbar region without neurogenic claudication: Secondary | ICD-10-CM | POA: Insufficient documentation

## 2021-01-20 DIAGNOSIS — M5416 Radiculopathy, lumbar region: Secondary | ICD-10-CM | POA: Diagnosis not present

## 2021-01-20 DIAGNOSIS — M5136 Other intervertebral disc degeneration, lumbar region: Secondary | ICD-10-CM | POA: Diagnosis not present

## 2021-02-18 DIAGNOSIS — M5416 Radiculopathy, lumbar region: Secondary | ICD-10-CM | POA: Diagnosis not present

## 2021-02-18 DIAGNOSIS — M5136 Other intervertebral disc degeneration, lumbar region: Secondary | ICD-10-CM | POA: Diagnosis not present

## 2021-03-03 ENCOUNTER — Ambulatory Visit: Payer: Medicare HMO | Admitting: Dermatology

## 2021-03-17 DIAGNOSIS — M5416 Radiculopathy, lumbar region: Secondary | ICD-10-CM | POA: Diagnosis not present

## 2021-03-17 DIAGNOSIS — M47816 Spondylosis without myelopathy or radiculopathy, lumbar region: Secondary | ICD-10-CM | POA: Insufficient documentation

## 2021-03-17 DIAGNOSIS — M961 Postlaminectomy syndrome, not elsewhere classified: Secondary | ICD-10-CM | POA: Diagnosis not present

## 2021-03-17 DIAGNOSIS — M5136 Other intervertebral disc degeneration, lumbar region: Secondary | ICD-10-CM | POA: Diagnosis not present

## 2021-04-28 DIAGNOSIS — M47816 Spondylosis without myelopathy or radiculopathy, lumbar region: Secondary | ICD-10-CM | POA: Diagnosis not present

## 2021-05-22 ENCOUNTER — Emergency Department (HOSPITAL_COMMUNITY)
Admission: EM | Admit: 2021-05-22 | Discharge: 2021-05-23 | Disposition: A | Discharge status: Home / Self Care | Payer: Medicare HMO | Attending: Emergency Medicine | Admitting: Emergency Medicine

## 2021-05-22 ENCOUNTER — Ambulatory Visit
Admission: EM | Admit: 2021-05-22 | Discharge: 2021-05-22 | Discharge status: Against Medical Advice | Payer: Medicare HMO

## 2021-05-22 ENCOUNTER — Other Ambulatory Visit: Payer: Self-pay

## 2021-05-22 DIAGNOSIS — Z23 Encounter for immunization: Secondary | ICD-10-CM | POA: Insufficient documentation

## 2021-05-22 DIAGNOSIS — I10 Essential (primary) hypertension: Secondary | ICD-10-CM | POA: Insufficient documentation

## 2021-05-22 DIAGNOSIS — W268XXA Contact with other sharp object(s), not elsewhere classified, initial encounter: Secondary | ICD-10-CM | POA: Insufficient documentation

## 2021-05-22 DIAGNOSIS — Z85828 Personal history of other malignant neoplasm of skin: Secondary | ICD-10-CM | POA: Insufficient documentation

## 2021-05-22 DIAGNOSIS — S61411A Laceration without foreign body of right hand, initial encounter: Secondary | ICD-10-CM | POA: Insufficient documentation

## 2021-05-22 DIAGNOSIS — M19041 Primary osteoarthritis, right hand: Secondary | ICD-10-CM | POA: Diagnosis not present

## 2021-05-22 DIAGNOSIS — R52 Pain, unspecified: Secondary | ICD-10-CM

## 2021-05-22 DIAGNOSIS — Z79899 Other long term (current) drug therapy: Secondary | ICD-10-CM | POA: Diagnosis not present

## 2021-05-22 NOTE — ED Provider Notes (Signed)
Emergency Medicine Provider Triage Evaluation Note  Pamela Robinson , a 87 y.o. female  was evaluated in triage.  Pt complains of a laceration to the dorsal aspect of the right hand near the thumb.  This happened just prior to arrival.  She states that she was dropping basket which caught her in the hand causing the laceration.  Review of Systems  Positive:  Negative: See above   Physical Exam  Ht 5\' 5"  (1.651 m)    Wt 62.6 kg    BMI 22.96 kg/m  Gen:   Awake, no distress   Resp:  Normal effort  MSK:   Moves extremities without difficulty  Other:  10 cm deep U-shaped laceration to the dorsal aspect of the hand.  She has full range of motion in the fingers.  There are visible tendons.  Strong 2+ radial pulse.  Medical Decision Making  Medically screening exam initiated at 8:03 PM.  Appropriate orders placed.  Beatris Belen was informed that the remainder of the evaluation will be completed by another provider, this initial triage assessment does not replace that evaluation, and the importance of remaining in the ED until their evaluation is complete.     Hendricks Limes, PA-C 05/22/21 2005    Lorelle Gibbs, DO 05/23/21 [phone]

## 2021-05-22 NOTE — ED Triage Notes (Addendum)
Pt presents laceration to right hand from a plastic basket. Tendons exposed. Mobility intact. Bleeding controlled. Uncertain if tetanus is up to date.

## 2021-05-23 ENCOUNTER — Emergency Department (HOSPITAL_COMMUNITY): Payer: Medicare HMO

## 2021-05-23 DIAGNOSIS — M19041 Primary osteoarthritis, right hand: Secondary | ICD-10-CM | POA: Diagnosis not present

## 2021-05-23 DIAGNOSIS — S61411A Laceration without foreign body of right hand, initial encounter: Secondary | ICD-10-CM | POA: Diagnosis not present

## 2021-05-23 MED ORDER — CEPHALEXIN 500 MG PO CAPS: 500.0000 mg | ORAL_CAPSULE | Freq: Three times a day (TID) | ORAL | 0 refills | Status: DC

## 2021-05-23 MED ORDER — HYDROCODONE-ACETAMINOPHEN 5-325 MG PO TABS
1.0000 | ORAL_TABLET | Freq: Once | ORAL | Status: AC
  Administered 2021-05-23: 1 via ORAL
  Filled 2021-05-23: qty 1

## 2021-05-23 MED ORDER — TETANUS-DIPHTH-ACELL PERTUSSIS 5-2.5-18.5 LF-MCG/0.5 IM SUSY
0.5000 mL | PREFILLED_SYRINGE | Freq: Once | INTRAMUSCULAR | Status: AC
  Administered 2021-05-23: 0.5 mL via INTRAMUSCULAR
  Filled 2021-05-23: qty 0.5

## 2021-05-23 MED ORDER — LIDOCAINE HCL (PF) 1 % IJ SOLN
15.0000 mL | Freq: Once | INTRAMUSCULAR | Status: AC
  Administered 2021-05-23: 15 mL
  Filled 2021-05-23: qty 15

## 2021-05-23 NOTE — ED Notes (Signed)
VS did not transfer in triage. Requested Sort NT repeat VS ASAP.

## 2021-05-23 NOTE — ED Provider Notes (Signed)
I provided a substantive portion of the care of this patient.  I personally performed the entirety of the exam for this encounter.     Patient got a laceration to the dorsal aspect of the right hand from a hook on a flowerpot that she was moving.  Exam shows a flap laceration to the dorsum of the hand approximately between the first and second digits.  Retracting flap shows no significant tendon or vascular injury.  Patient has good strength for flexion and extension against resistance of the index finger and thumb.  Agree with plan of management.   Charlesetta Shanks, MD 05/23/21 [phone]

## 2021-05-23 NOTE — ED Notes (Signed)
Follow up on pt's VS. BP 204/83. Pt reports HX elevated BP and missed afternoon dose of medications. Pt states BP is not usually this high. Has had ongoing headache since incident of laceration. No acute symptoms now. PIT PA courtni informed.

## 2021-05-23 NOTE — Discharge Instructions (Addendum)
You had your laceration repaired with 8 stitches today.  This will need to be removed in about 7 to 10 days.  You can return to the emergency room or follow-up with your primary care provider to have the stitches removed.  I have sent in your antibiotics to your pharmacy.  If you have any redness, swelling, or drainage from the site please return to have this evaluated for an infection.

## 2021-05-23 NOTE — ED Provider Notes (Signed)
[facility] EMERGENCY DEPARTMENT Provider Note   CSN: [id number] Arrival date & time: 05/22/21  1803     History Chief Complaint  Patient presents with   Laceration    Pamela Robinson is a 87 y.o. female.  87 year old female presents today for evaluation of laceration to right hand that occurred around 5 PM yesterday.  Patient reports she was removing hanging baskets to bring inside the hook on one of the baskets caught her hand resulting in this laceration.  She denies other injuries or complaints.   The history is provided by the patient. No language interpreter was used.  Laceration Associated symptoms: no fever       Past Medical History:  Diagnosis Date   Basal cell carcinoma 12/10/2019   right malar cheek mohs   Diverticulitis    Hyperlipidemia    Hypertension     There are no problems to display for this patient.   Past Surgical History:  Procedure Laterality Date   ABDOMINAL HYSTERECTOMY     APPENDECTOMY     BLADDER SUSPENSION     TONSILLECTOMY       OB History   No obstetric history on file.     No family history on file.  Social History   Tobacco Use   Smoking status: Never   Smokeless tobacco: Never  Substance Use Topics   Alcohol use: Yes    Comment: minimal, occasional wine    Home Medications Prior to Admission medications   Medication Sig Start Date End Date Taking? Authorizing Provider  atenolol (TENORMIN) 50 MG tablet Take 50 mg by mouth 2 (two) times daily. 11/01/17   [provider]  ciprofloxacin (CIPRO) 500 MG tablet Take 1 tablet (500 mg total) by mouth every 12 (twelve) hours. 08/22/13   Blanchie Dessert, MD  irbesartan-hydrochlorothiazide (AVALIDE) 300-12.5 MG tablet Take 1 tablet by mouth daily. 11/05/17   [provider]  mirtazapine (REMERON) 15 MG tablet 3 TABLETS AT BEDTIME ORALLY 90 DAYS 09/22/17   [provider]  ondansetron (ZOFRAN) 4 MG tablet Take 1 tablet (4 mg total) by  mouth every 6 (six) hours. 08/22/13   Blanchie Dessert, MD  prednisoLONE acetate (PRED FORTE) 1 % ophthalmic suspension INSTILL 1 DROP INTO RIGHT EYE EVERY OTHER DAY 08/31/17   [provider]  simvastatin (ZOCOR) 40 MG tablet TAKE 1 TABLET BY MOUTH EVERY EVENING ONCE A DAY 10/21/17   [provider]    Allergies    Shellfish allergy, Codeine, and Sulfa antibiotics  Review of Systems   Review of Systems  Constitutional:  Negative for chills and fever.  Skin:  Positive for wound.  Neurological:  Negative for weakness and numbness.   Physical Exam Updated Vital Signs BP (!) 194/80 (BP Location: Left Arm)    Pulse 65    Temp 98.5 F (36.9 C) (Oral)    Resp 17    Ht 5\' 5"  (1.651 m)    Wt 62.6 kg    SpO2 96%    BMI 22.96 kg/m   Physical Exam Vitals and nursing note reviewed.  Constitutional:      General: She is not in acute distress.    Appearance: Normal appearance. She is not ill-appearing.  HENT:     Head: Normocephalic and atraumatic.     Nose: Nose normal.  Eyes:     Conjunctiva/sclera: Conjunctivae normal.  Cardiovascular:     Rate and Rhythm: Normal rate and regular rhythm.  Pulmonary:  Effort: Pulmonary effort is normal. No respiratory distress.  Musculoskeletal:        General: No deformity.     Comments: Right hand with deep U-shaped laceration.  Full range of motion intact, sensation intact in all digits of the right hand, 2+ radial and ulnar pulses present.  Skin:    Findings: No rash.     Comments: 10 cm U-shaped laceration to dorsum of hand between thumb and index finger.  Has a detached flap.  Without tendon, nerve, or muscle damage.  Neurological:     Mental Status: She is alert.        ED Results / Procedures / Treatments   Labs (all labs ordered are listed, but only abnormal results are displayed) Labs Reviewed - No data to display  EKG None  Radiology DG Hand Complete Right  Result Date: 05/23/2021 CLINICAL DATA:  Right hand  laceration EXAM: RIGHT HAND - COMPLETE 3+ VIEW COMPARISON:  None. FINDINGS: No acute fracture or dislocation. Changes of advanced degenerative arthritis are noted with exuberant osteophyte formation involving the DIP and PIP joints of the fingers, the first CMC joint, interphalangeal joint, and to a lesser extent the first MCP joint of the thumb, and the MCP joints of the fingers, though changes in this region appear much less severe. Soft tissues are unremarkable. IMPRESSION: No acute fracture or dislocation. Advanced polyarticular degenerative arthritis. Electronically Signed   By: Fidela Salisbury M.D.   On: 05/23/2021 01:32    Procedures .Marland KitchenLaceration Repair  Date/Time: 05/23/2021 8:31 AM Performed by: Evlyn Courier, PA-C Authorized by: Evlyn Courier, PA-C   Consent:    Consent obtained:  Verbal   Consent given by:  Patient   Risks discussed:  Infection, need for additional repair, pain, poor cosmetic result and poor wound healing   Alternatives discussed:  No treatment and delayed treatment Universal protocol:    Procedure explained and questions answered to patient or proxy's satisfaction: yes     Relevant documents present and verified: yes     Patient identity confirmed:  Verbally with patient and arm band Anesthesia:    Anesthesia method:  Local infiltration   Local anesthetic:  Lidocaine 1% w/o epi Laceration details:    Location:  Hand   Hand location:  R hand, dorsum   Length (cm):  10 Pre-procedure details:    Preparation:  Patient was prepped and draped in usual sterile fashion and imaging obtained to evaluate for foreign bodies Exploration:    Hemostasis achieved with:  Direct pressure   Imaging obtained: x-ray     Imaging outcome: foreign body not noted     Wound exploration: entire depth of wound visualized     Wound extent: no foreign bodies/material noted, no muscle damage noted, no nerve damage noted, no tendon damage noted, no underlying fracture noted and no vascular  damage noted   Treatment:    Area cleansed with:  Povidone-iodine   Irrigation solution:  Sterile saline   Irrigation volume:  250   Irrigation method:  Syringe Skin repair:    Repair method:  Sutures   Suture size:  4-0   Suture material:  Prolene   Suture technique:  Simple interrupted   Number of sutures:  8 Approximation:    Approximation:  Close Repair type:    Repair type:  Complex Post-procedure details:    Dressing:  Non-adherent dressing   Procedure completion:  Tolerated well, no immediate complications   Medications Ordered in ED Medications  HYDROcodone-acetaminophen (  NORCO/VICODIN) 5-325 MG per tablet 1 tablet (has no administration in time range)  lidocaine (PF) (XYLOCAINE) 1 % injection 15 mL (has no administration in time range)    ED Course  I have reviewed the triage vital signs and the nursing notes.  Pertinent labs & imaging results that were available during my care of the patient were reviewed by me and considered in my medical decision making (see chart for details).    MDM Rules/Calculators/A&P                         10 cm U-shaped laceration to dorsum of right hand between thumb and index finger.  Neurovascularly intact.  Full range of motion and strength preserved.  Repaired with 8 stitches.  We will provide Keflex.  Return precautions discussed.  Patient and son voiced understanding and are in agreement with plan.     Final Clinical Impression(s) / ED Diagnoses Final diagnoses:  Laceration of right hand without foreign body, initial encounter    Rx / DC Orders ED Discharge Orders          Ordered    cephALEXin (KEFLEX) 500 MG capsule  3 times daily        05/23/21 [place], [place], PA-C 05/23/21 0076    Charlesetta Shanks, MD 05/27/21 [phone]

## 2021-06-10 ENCOUNTER — Ambulatory Visit: Payer: Medicare HMO | Admitting: Dermatology

## 2021-07-17 ENCOUNTER — Other Ambulatory Visit: Payer: Self-pay

## 2021-07-17 ENCOUNTER — Ambulatory Visit: Payer: Medicare Other | Admitting: Podiatry

## 2021-07-17 DIAGNOSIS — L97512 Non-pressure chronic ulcer of other part of right foot with fat layer exposed: Secondary | ICD-10-CM

## 2021-07-17 DIAGNOSIS — L539 Erythematous condition, unspecified: Secondary | ICD-10-CM

## 2021-07-17 MED ORDER — DOXYCYCLINE HYCLATE 100 MG PO TABS: 100.0000 mg | ORAL_TABLET | Freq: Two times a day (BID) | ORAL | 0 refills | Status: AC

## 2021-07-23 NOTE — Progress Notes (Signed)
Subjective:  Patient ID: Pamela Robinson, female    DOB: 06-20-1932,  MRN: [id number]  Chief Complaint  Patient presents with   Toe Pain    Right foot 2nd toe place between the toe causing pain     88 y.o. female presents for wound care.  Patient presents with complaint of right second digit medial side wound.  Patient said there is some discomfort in between the toes.  The big toe is pushing against the second toe leading to the discomfort and pressure.  She states that it is painful to walk on painful with some type of shoes.  She has not tried anything for it.  She would like to see a foot doctor.   Review of Systems: Negative except as noted in the HPI. Denies N/V/F/Ch.  Past Medical History:  Diagnosis Date   Basal cell carcinoma 12/10/2019   right malar cheek mohs   Diverticulitis    Hyperlipidemia    Hypertension     Current Outpatient Medications:    doxycycline (VIBRA-TABS) 100 MG tablet, Take 1 tablet (100 mg total) by mouth 2 (two) times daily for 14 days., Disp: 28 tablet, Rfl: 0   atenolol (TENORMIN) 50 MG tablet, Take 50 mg by mouth 2 (two) times daily., Disp: , Rfl: 12   cephALEXin (KEFLEX) 500 MG capsule, Take 1 capsule (500 mg total) by mouth 3 (three) times daily., Disp: 20 capsule, Rfl: 0   ciprofloxacin (CIPRO) 500 MG tablet, Take 1 tablet (500 mg total) by mouth every 12 (twelve) hours., Disp: 14 tablet, Rfl: 0   irbesartan-hydrochlorothiazide (AVALIDE) 300-12.5 MG tablet, Take 1 tablet by mouth daily., Disp: , Rfl: 9   mirtazapine (REMERON) 15 MG tablet, 3 TABLETS AT BEDTIME ORALLY 90 DAYS, Disp: , Rfl: 1   ondansetron (ZOFRAN) 4 MG tablet, Take 1 tablet (4 mg total) by mouth every 6 (six) hours., Disp: 12 tablet, Rfl: 0   prednisoLONE acetate (PRED FORTE) 1 % ophthalmic suspension, INSTILL 1 DROP INTO RIGHT EYE EVERY OTHER DAY, Disp: , Rfl: 3   simvastatin (ZOCOR) 40 MG tablet, TAKE 1 TABLET BY MOUTH EVERY EVENING ONCE A DAY, Disp: , Rfl: 5  Social  History   Tobacco Use  Smoking Status Never  Smokeless Tobacco Never    Allergies  Allergen Reactions   Shellfish Allergy Anaphylaxis   Codeine Nausea And Vomiting   Sulfa Antibiotics Nausea And Vomiting   Objective:  There were no vitals filed for this visit. There is no height or weight on file to calculate BMI. Constitutional Well developed. Well nourished.  Vascular Dorsalis pedis pulses palpable bilaterally. Posterior tibial pulses palpable bilaterally. Capillary refill normal to all digits.  No cyanosis or clubbing noted. Pedal hair growth normal.  Neurologic Normal speech. Oriented to person, place, and time. Protective sensation absent  Dermatologic Wound Location: Right second digit medial wound Wound Base: Mixed Granular/Fibrotic Peri-wound: Calloused Exudate: Scant/small amount Serosanguinous exudate Wound Measurements: -See below  Orthopedic: No pain to palpation either foot.   Radiographs: None Assessment:   1. Erythema   2. Ulcer of toe, right, with fat layer exposed ([place])    Plan:  Patient was evaluated and treated and all questions answered.  Ulcer right second digit medial wound with underlying erythema -Debridement as below. -Dressed with Betadine wet-to-dry, DSD. -Continue off-loading with surgical shoe. -Doxycycline was dispensed for skin and soft tissue prophylaxis  Procedure: Excisional Debridement of Wound Tool: Sharp chisel blade/tissue nipper Rationale: Removal of non-viable soft tissue from  the wound to promote healing.  Anesthesia: none Pre-Debridement Wound Measurements: 0.5 cm x 0.3 cm x 0.3 cm  Post-Debridement Wound Measurements: 0.6 cm x 0.3 cm x 0.3 cm  Type of Debridement: Sharp Excisional Tissue Removed: Non-viable soft tissue Blood loss: Minimal (<50cc) Depth of Debridement: subcutaneous tissue. Technique: Sharp excisional debridement to bleeding, viable wound base.  Wound Progress: This is my initial evaluation of  continue monitor the progression of the wound Site healing conversation 7 Dressing: Dry, sterile, compression dressing. Disposition: Patient tolerated procedure well. Patient to return in 1 week for follow-up.  No follow-ups on file.

## 2021-08-07 ENCOUNTER — Ambulatory Visit: Payer: Medicare Other | Admitting: Podiatry

## 2021-08-17 ENCOUNTER — Encounter: Payer: Self-pay | Admitting: Podiatrist

## 2021-08-17 ENCOUNTER — Ambulatory Visit: Payer: Medicare Other | Admitting: Podiatrist

## 2021-08-17 ENCOUNTER — Other Ambulatory Visit: Payer: Self-pay

## 2021-08-17 DIAGNOSIS — M79609 Pain in unspecified limb: Secondary | ICD-10-CM | POA: Diagnosis not present

## 2021-08-17 DIAGNOSIS — Z872 Personal history of diseases of the skin and subcutaneous tissue: Secondary | ICD-10-CM | POA: Diagnosis not present

## 2021-08-17 DIAGNOSIS — B351 Tinea unguium: Secondary | ICD-10-CM | POA: Diagnosis not present

## 2021-08-17 NOTE — Progress Notes (Signed)
Chief Complaint  ?Patient presents with  ? Callouses  ?  Right foot 2nd toe   ?  ? ?HPI: Patient is 88 y.o. female who presents today for follow-up of ulceration second digit right foot she states that she did the wound care that was recommended consisting of Betadine wet-to-dry's and she took her doxycycline as instructed.  She also relates her nails are elongated and uncomfortable in shoes as well as thickened. ? ? ?Allergies  ?Allergen Reactions  ? Shellfish Allergy Anaphylaxis  ? Codeine Nausea And Vomiting  ? Sulfa Antibiotics Nausea And Vomiting  ? ? ?Review of systems is negative except as noted in the HPI.  Denies nausea/ vomiting/ fevers/ chills or night sweats.   Denies difficulty breathing, denies calf pain or tenderness ? ?Physical Exam ? ?Patient is awake, alert, and oriented x 3.  In no acute distress.   ? ?Vascular status is intact with palpable pedal pulses DP and PT bilateral and capillary refill time less than 3 seconds bilateral.  No edema or erythema noted.  ? ?Neurological exam reveals absent epicritic and protective sensation   ? ?Dermatological exam reveals skin is supple and dry to bilateral feet. Ulceration on the right second toe has healed.  Digital nails are thick, discolored, dystrophic, brittle with subungual debris present and clinically mycotic x 10.   ? ? ?Musculoskeletal exam: hammertoe second toe bilateral is noted.   ? ?Assessment: ?  ICD-10-CM   ?1. Healed foot ulcer  Z87.2   ?  ?2. Pain due to onychomycosis of nail  B35.1   ? M79.609   ?  ? ? ? ?Plan: ?Discussed that her ulceration is fully healed and she did very well with her wound care.  I advised against any self care of corns in the future.  I also debrided her toenails for her today in thickness and length.  She will be seen back for follow up as needed in the future.   ? ?

## 2021-08-17 NOTE — Patient Instructions (Signed)

## 2021-08-25 DIAGNOSIS — I10 Essential (primary) hypertension: Secondary | ICD-10-CM | POA: Diagnosis not present

## 2021-08-25 DIAGNOSIS — M48061 Spinal stenosis, lumbar region without neurogenic claudication: Secondary | ICD-10-CM | POA: Diagnosis not present

## 2021-09-07 DIAGNOSIS — N390 Urinary tract infection, site not specified: Secondary | ICD-10-CM | POA: Diagnosis not present

## 2021-09-07 DIAGNOSIS — R300 Dysuria: Secondary | ICD-10-CM | POA: Diagnosis not present

## 2021-09-29 DIAGNOSIS — M4804 Spinal stenosis, thoracic region: Secondary | ICD-10-CM | POA: Diagnosis not present

## 2021-09-29 DIAGNOSIS — R5383 Other fatigue: Secondary | ICD-10-CM | POA: Diagnosis not present

## 2021-09-29 DIAGNOSIS — L570 Actinic keratosis: Secondary | ICD-10-CM | POA: Diagnosis not present

## 2021-09-29 DIAGNOSIS — I10 Essential (primary) hypertension: Secondary | ICD-10-CM | POA: Diagnosis not present

## 2021-09-29 DIAGNOSIS — D044 Carcinoma in situ of skin of scalp and neck: Secondary | ICD-10-CM | POA: Diagnosis not present

## 2021-09-29 DIAGNOSIS — R011 Cardiac murmur, unspecified: Secondary | ICD-10-CM | POA: Diagnosis not present

## 2021-09-29 DIAGNOSIS — E7800 Pure hypercholesterolemia, unspecified: Secondary | ICD-10-CM | POA: Diagnosis not present

## 2021-09-29 DIAGNOSIS — G44209 Tension-type headache, unspecified, not intractable: Secondary | ICD-10-CM | POA: Diagnosis not present

## 2021-09-29 DIAGNOSIS — Z0000 Encounter for general adult medical examination without abnormal findings: Secondary | ICD-10-CM | POA: Diagnosis not present

## 2021-10-01 NOTE — Progress Notes (Deleted)
Cardiology Office Note:    Date:  10/01/2021   ID:  Pamela Robinson, DOB [birth year], MRN [id number]  PCP:  Kelton Pillar, MD  Cardiologist:  None   Referring MD: Kelton Pillar, MD   No chief complaint on file.   History of Present Illness:    Pamela Robinson is a 88 y.o. female with a hx of new murmur of aortic stenosis.  Other problems include hyperlipidemia and primary hypertension.  ***  Past Medical History:  Diagnosis Date   Basal cell carcinoma 12/10/2019   right malar cheek mohs   Diverticulitis    Hyperlipidemia    Hypertension     Past Surgical History:  Procedure Laterality Date   ABDOMINAL HYSTERECTOMY     APPENDECTOMY     BLADDER SUSPENSION     TONSILLECTOMY      Current Medications: No outpatient medications have been marked as taking for the 10/02/21 encounter (Appointment) with Belva Crome, MD.     Allergies:   Shellfish allergy, Codeine, and Sulfa antibiotics   Social History   Socioeconomic History   Marital status: Married    Spouse name: Not on file   Number of children: Not on file   Years of education: Not on file   Highest education level: Not on file  Occupational History   Not on file  Tobacco Use   Smoking status: Never   Smokeless tobacco: Never  Substance and Sexual Activity   Alcohol use: Yes    Comment: minimal, occasional wine   Drug use: Not on file   Sexual activity: Not on file  Other Topics Concern   Not on file  Social History Narrative   Not on file   Social Determinants of Health   Financial Resource Strain: Not on file  Food Insecurity: Not on file  Transportation Needs: Not on file  Physical Activity: Not on file  Stress: Not on file  Social Connections: Not on file     Family History: The patient's family history is not on file.  ROS:   Please see the history of present illness.    *** All other systems reviewed and are negative.  EKGs/Labs/Other Studies Reviewed:    The following  studies were reviewed today: ***  EKG:  EKG ***  Recent Labs: No results found for requested labs within last 8760 hours.  Recent Lipid Panel No results found for: CHOL, TRIG, HDL, CHOLHDL, VLDL, LDLCALC, LDLDIRECT  Physical Exam:    VS:  There were no vitals taken for this visit.    Wt Readings from Last 3 Encounters:  05/22/21 138 lb (62.6 kg)  08/22/13 138 lb (62.6 kg)  06/29/11 135 lb (61.2 kg)     GEN: ***. No acute distress HEENT: Normal NECK: No JVD. LYMPHATICS: No lymphadenopathy CARDIAC: *** murmur. RRR *** gallop, or edema. VASCULAR: *** Normal Pulses. No bruits. RESPIRATORY:  Clear to auscultation without rales, wheezing or rhonchi  ABDOMEN: Soft, non-tender, non-distended, No pulsatile mass, MUSCULOSKELETAL: No deformity  SKIN: Warm and dry NEUROLOGIC:  Alert and oriented x 3 PSYCHIATRIC:  Normal affect   ASSESSMENT:    1. Heart murmur   2. Primary hypertension   3. Hyperlipidemia LDL goal <70    PLAN:    In order of problems listed above:  ***   Medication Adjustments/Labs and Tests Ordered: Current medicines are reviewed at length with the patient today.  Concerns regarding medicines are outlined above.  No orders of the defined types were  placed in this encounter.  No orders of the defined types were placed in this encounter.   There are no Patient Instructions on file for this visit.   Signed, Sinclair Grooms, MD  10/01/2021 5:29 PM    [facility]

## 2021-10-02 ENCOUNTER — Ambulatory Visit: Payer: Medicare Other | Admitting: Interventional Cardiology

## 2021-10-05 ENCOUNTER — Ambulatory Visit: Payer: Medicare Other | Admitting: Cardiovascular Disease

## 2021-10-05 ENCOUNTER — Encounter: Payer: Self-pay | Admitting: Cardiovascular Disease

## 2021-10-05 ENCOUNTER — Encounter (INDEPENDENT_AMBULATORY_CARE_PROVIDER_SITE_OTHER): Payer: Self-pay

## 2021-10-05 VITALS — BP 140/100 | HR 63 | Ht 64.0 in | Wt 144.0 lb

## 2021-10-05 DIAGNOSIS — I10 Essential (primary) hypertension: Secondary | ICD-10-CM | POA: Diagnosis not present

## 2021-10-05 DIAGNOSIS — I350 Nonrheumatic aortic (valve) stenosis: Secondary | ICD-10-CM | POA: Diagnosis not present

## 2021-10-05 MED ORDER — AMLODIPINE BESYLATE 5 MG PO TABS: 5.0000 mg | ORAL_TABLET | Freq: Every day | ORAL | 3 refills | Status: DC

## 2021-10-05 NOTE — Patient Instructions (Signed)
Medication Instructions:  ?START Amlodipine '5mg'$  daily ?*If you need a refill on your cardiac medications before your next appointment, please call your pharmacy* ? ? ?Lab Work: ?NONE ?If you have labs (blood work) drawn today and your tests are completely normal, you will receive your results only by: ?MyChart Message (if you have MyChart) OR ?A paper copy in the mail ?If you have any lab test that is abnormal or we need to change your treatment, we will call you to review the results. ? ? ?Testing/Procedures: ?ECHO ?Your physician has requested that you have an echocardiogram. Echocardiography is a painless test that uses sound waves to create images of your heart. It provides your doctor with information about the size and shape of your heart and how well your heart?s chambers and valves are working. This procedure takes approximately one hour. There are no restrictions for this procedure. ? ?Follow-Up: ?At [facility], you and your health needs are our priority.  As part of our continuing mission to provide you with exceptional heart care, we have created designated Provider Care Teams.  These Care Teams include your primary Cardiologist (physician) and Advanced Practice Providers (APPs -  Physician Assistants and Nurse Practitioners) who all work together to provide you with the care you need, when you need it. ? ?We recommend signing up for the patient portal called "MyChart".  Sign up information is provided on this After Visit Summary.  MyChart is used to connect with patients for Virtual Visits (Telemedicine).  Patients are able to view lab/test results, encounter notes, upcoming appointments, etc.  Non-urgent messages can be sent to your provider as well.   ?To learn more about what you can do with MyChart, go to [email or web address].   ? ?Your next appointment:   ?1 year(s) ? ?The format for your next appointment:   ?In Person ? ?Provider:   ?Sherren Mocha, MD1}  ? ?  ? ?Important Information About  [place] ? ? ? ? ?  ?

## 2021-10-05 NOTE — Progress Notes (Signed)
Cardiology Office Note:    Date:  10/09/2021   ID:  Pamela Robinson, DOB May 26, 1932, MRN [id number]  PCP:  Kelton Pillar, MD   [facility] Cardiologist:  Sherren Mocha, MD     Referring MD: Kelton Pillar, MD   Chief Complaint  Patient presents with   Heart Murmur    History of Present Illness:    Pamela Robinson is a 88 y.o. female referred by Dr Laurann Montana for evaluation of cardiac murmur. The patient does not recall being told of a murmur in the past. She is doing well from a symptomatic perspective and remains functionally independent without chest pain, shortness of breath, lightheadedness, or syncope. She admits to generalized fatigue and has attributed this to ageing. She has no hx of rheumatic fever, endocarditis, or family hx of valvular heart disease.   Past Medical History:  Diagnosis Date   Basal cell carcinoma 12/10/2019   right malar cheek mohs   Disorder of bone density and structure, unspecified    Diverticulitis    Dysuria    Fatigue    Heart murmur    Hypercholesterolemia    Hyperlipidemia    Hypertension    Mild mood disorder (HCC)    Primary generalized (osteo)arthritis    Sleep disorder    Spinal stenosis of thoracic region    Tension headache     Past Surgical History:  Procedure Laterality Date   ABDOMINAL HYSTERECTOMY     APPENDECTOMY     BLADDER SUSPENSION     TONSILLECTOMY      Current Medications: Current Meds  Medication Sig   amLODipine (NORVASC) 5 MG tablet Take 1 tablet (5 mg total) by mouth daily.   atenolol (TENORMIN) 50 MG tablet Take 50 mg by mouth 2 (two) times daily.   BIOTIN PO Take by mouth.   CALCIUM GLUCONATE PO Take by mouth.   mirtazapine (REMERON) 15 MG tablet 3 TABLETS AT BEDTIME ORALLY 90 DAYS   ondansetron (ZOFRAN) 4 MG tablet Take 1 tablet (4 mg total) by mouth every 6 (six) hours.   Oxycodone HCl 20 MG TABS Take 1 tablet by mouth 3 (three) times daily as needed.   PARoxetine (PAXIL) 20 MG  tablet Take 20 mg by mouth daily.   simvastatin (ZOCOR) 40 MG tablet TAKE 1 TABLET BY MOUTH EVERY EVENING ONCE A DAY   valsartan-hydrochlorothiazide (DIOVAN-HCT) 320-12.5 MG tablet Take 1 tablet by mouth daily.     Allergies:   Shellfish allergy, Codeine, Sulfa antibiotics, and Angiotensin receptor blockers   Social History   Socioeconomic History   Marital status: Married    Spouse name: Not on file   Number of children: Not on file   Years of education: Not on file   Highest education level: Not on file  Occupational History   Not on file  Tobacco Use   Smoking status: Never   Smokeless tobacco: Never  Substance and Sexual Activity   Alcohol use: Yes    Comment: minimal, occasional wine   Drug use: Not on file   Sexual activity: Not on file  Other Topics Concern   Not on file  Social History Narrative   Not on file   Social Determinants of Health   Financial Resource Strain: Not on file  Food Insecurity: Not on file  Transportation Needs: Not on file  Physical Activity: Not on file  Stress: Not on file  Social Connections: Not on file     Family History: The patient's  family history is not on file.  ROS:   Please see the history of present illness.    All other systems reviewed and are negative.  EKGs/Labs/Other Studies Reviewed:    The following studies were reviewed today: none  EKG:  EKG is ordered today.  The ekg ordered today demonstrates normal sinus rhythm with nonspecific IVCD and first degree A block.   Recent Labs: No results found for requested labs within last 8760 hours.  Recent Lipid Panel No results found for: CHOL, TRIG, HDL, CHOLHDL, VLDL, LDLCALC, LDLDIRECT   Risk Assessment/Calculations:           Physical Exam:    VS:  BP (!) 140/100   Pulse 63   Ht '5\' 4"'$  (1.626 m)   Wt 144 lb (65.3 kg)   SpO2 95%   BMI 24.72 kg/m     Wt Readings from Last 3 Encounters:  10/05/21 144 lb (65.3 kg)  05/22/21 138 lb (62.6 kg)  08/22/13  138 lb (62.6 kg)     GEN:  Well nourished, well developed in no acute distress HEENT: Normal NECK: No JVD; No carotid bruits LYMPHATICS: No lymphadenopathy CARDIAC: RRR, 2/6 harsh mid-peaking systolic murmur at the RUSB, A2 present RESPIRATORY:  Clear to auscultation without rales, wheezing or rhonchi  ABDOMEN: Soft, non-tender, non-distended MUSCULOSKELETAL:  No edema; No deformity  SKIN: Warm and dry NEUROLOGIC:  Alert and oriented x 3 PSYCHIATRIC:  Normal affect   ASSESSMENT:    1. Nonrheumatic aortic valve stenosis   2. Essential hypertension    PLAN:    In order of problems listed above:  The patient has a typical murmur of aortic stenosis. Based on murmur characteristics (mid-peaking, preserved A2), I suspect she has no more than moderate AS. We have discussed the natural history of aortic stenosis today. She understands that it is typically a slow, progressive disease process that is associated with symptoms of dyspnea, fatigue, chest discomfort, and lightheadedness/syncope. She understands that untreated, severe aortic stenosis carries a poor prognosis. We discussed potential treatment options that might be relevant to her in the future, such as TAVR. I have ordered an echo to define the severity of aortic stenosis and evaluate for LV dysfunction/LVH, and any concomitant valvular disease. At this point, I do not think she is experiencing symptoms related to aortic stenosis.  BP control is improving but remains suboptimal. We discussed BP goals. I have added amlodipine 5 mg daily to her medication regimen. She will continue on atenolol, valsartan, and HCTZ at current doses. She will continue close follow-up with her PCP.      Medication Adjustments/Labs and Tests Ordered: Current medicines are reviewed at length with the patient today.  Concerns regarding medicines are outlined above.  Orders Placed This Encounter  Procedures   EKG 12-Lead   ECHOCARDIOGRAM COMPLETE   Meds  ordered this encounter  Medications   amLODipine (NORVASC) 5 MG tablet    Sig: Take 1 tablet (5 mg total) by mouth daily.    Dispense:  90 tablet    Refill:  3    Patient Instructions  Medication Instructions:  START Amlodipine '5mg'$  daily *If you need a refill on your cardiac medications before your next appointment, please call your pharmacy*   Lab Work: NONE If you have labs (blood work) drawn today and your tests are completely normal, you will receive your results only by: [place] (if you have MyChart) OR A paper copy in the mail If you have any  lab test that is abnormal or we need to change your treatment, we will call you to review the results.   Testing/Procedures: ECHO Your physician has requested that you have an echocardiogram. Echocardiography is a painless test that uses sound waves to create images of your heart. It provides your doctor with information about the size and shape of your heart and how well your heart's chambers and valves are working. This procedure takes approximately one hour. There are no restrictions for this procedure.  Follow-Up: At [facility], you and your health needs are our priority.  As part of our continuing mission to provide you with exceptional heart care, we have created designated Provider Care Teams.  These Care Teams include your primary Cardiologist (physician) and Advanced Practice Providers (APPs -  Physician Assistants and Nurse Practitioners) who all work together to provide you with the care you need, when you need it.  We recommend signing up for the patient portal called "MyChart".  Sign up information is provided on this After Visit Summary.  MyChart is used to connect with patients for Virtual Visits (Telemedicine).  Patients are able to view lab/test results, encounter notes, upcoming appointments, etc.  Non-urgent messages can be sent to your provider as well.   To learn more about what you can do with MyChart, go to  [email or web address].    Your next appointment:   1 year(s)  The format for your next appointment:   In Person  Provider:   Sherren Mocha, MD1}      Important Information About Sugar         Signed, Sherren Mocha, MD  10/09/2021 1:43 PM    [place]

## 2021-10-13 ENCOUNTER — Ambulatory Visit (HOSPITAL_COMMUNITY): Payer: Medicare Other | Attending: Cardiology

## 2021-10-13 DIAGNOSIS — I10 Essential (primary) hypertension: Secondary | ICD-10-CM

## 2021-10-13 DIAGNOSIS — I350 Nonrheumatic aortic (valve) stenosis: Secondary | ICD-10-CM | POA: Diagnosis not present

## 2021-10-13 LAB — ECHOCARDIOGRAM COMPLETE
AR max vel: 1.85 cm2
AV Area VTI: 1.83 cm2
AV Area mean vel: 1.75 cm2
AV Mean grad: 8.5 mmHg
AV Peak grad: 15.3 mmHg
Ao pk vel: 1.96 m/s
Area-P 1/2: 3.17 cm2
P 1/2 time: 485 msec
S' Lateral: 1.8 cm

## 2021-10-27 ENCOUNTER — Other Ambulatory Visit: Payer: Self-pay | Admitting: Family Medicine

## 2021-10-27 DIAGNOSIS — Z1231 Encounter for screening mammogram for malignant neoplasm of breast: Secondary | ICD-10-CM

## 2021-11-16 DIAGNOSIS — G2581 Restless legs syndrome: Secondary | ICD-10-CM | POA: Diagnosis not present

## 2021-11-16 DIAGNOSIS — M961 Postlaminectomy syndrome, not elsewhere classified: Secondary | ICD-10-CM | POA: Diagnosis not present

## 2021-11-16 DIAGNOSIS — M5136 Other intervertebral disc degeneration, lumbar region: Secondary | ICD-10-CM | POA: Diagnosis not present

## 2021-11-16 DIAGNOSIS — M5416 Radiculopathy, lumbar region: Secondary | ICD-10-CM | POA: Diagnosis not present

## 2021-12-01 ENCOUNTER — Ambulatory Visit: Payer: Medicare Other

## 2021-12-17 DIAGNOSIS — M5416 Radiculopathy, lumbar region: Secondary | ICD-10-CM | POA: Diagnosis not present

## 2022-01-12 ENCOUNTER — Ambulatory Visit: Payer: Medicare Other

## 2022-01-20 DIAGNOSIS — Z5181 Encounter for therapeutic drug level monitoring: Secondary | ICD-10-CM | POA: Diagnosis not present

## 2022-01-20 DIAGNOSIS — M48062 Spinal stenosis, lumbar region with neurogenic claudication: Secondary | ICD-10-CM | POA: Diagnosis not present

## 2022-01-20 DIAGNOSIS — M5136 Other intervertebral disc degeneration, lumbar region: Secondary | ICD-10-CM | POA: Diagnosis not present

## 2022-01-20 DIAGNOSIS — M5416 Radiculopathy, lumbar region: Secondary | ICD-10-CM | POA: Diagnosis not present

## 2022-01-20 DIAGNOSIS — G2581 Restless legs syndrome: Secondary | ICD-10-CM | POA: Diagnosis not present

## 2022-02-20 DIAGNOSIS — Z23 Encounter for immunization: Secondary | ICD-10-CM | POA: Diagnosis not present

## 2022-02-22 ENCOUNTER — Ambulatory Visit: Payer: Medicare Other | Admitting: Neurology

## 2022-02-22 ENCOUNTER — Telehealth: Payer: Self-pay | Admitting: Neurology

## 2022-02-22 NOTE — Telephone Encounter (Signed)
Pt cancelled appt due to feeling sick, not able to get out of bed. Will call back to reschedule.

## 2022-03-30 ENCOUNTER — Encounter: Payer: Self-pay | Admitting: Neurology

## 2022-03-30 ENCOUNTER — Ambulatory Visit: Payer: Medicare Other | Admitting: Neurology

## 2022-03-30 VITALS — BP 145/79 | HR 60 | Ht 64.0 in | Wt 144.0 lb

## 2022-03-30 DIAGNOSIS — G2581 Restless legs syndrome: Secondary | ICD-10-CM | POA: Diagnosis not present

## 2022-03-30 DIAGNOSIS — G629 Polyneuropathy, unspecified: Secondary | ICD-10-CM

## 2022-03-30 MED ORDER — PREGABALIN 50 MG PO CAPS: 50.0000 mg | ORAL_CAPSULE | Freq: Every day | ORAL | 5 refills | Status: DC

## 2022-03-30 NOTE — Progress Notes (Signed)
[facility]  PATIENT: Pamela Robinson DOB: [birth year]  REQUESTING CLINICIAN: Suella Broad, MD HISTORY FROM: Patient and Son   REASON FOR VISIT: Restless leg syndrome    HISTORICAL  CHIEF COMPLAINT:  Chief Complaint  Patient presents with   New Patient (Initial Visit)    Rm 12. Accompanied by son, Fatima Sanger. NP/Paper Proficient/Emerge Murlean Iba MD/RLS.    HISTORY OF PRESENT ILLNESS:  This is a 88 year old gentleman woman past medical history of hypertension hyperlipidemia, restless leg syndrome, chronic back pain on oxycodone who is presenting for evaluation of the restless leg syndrome.  Patient reports having restless leg syndrome for more than 10 years.  She reports that her symptoms are not every night, they are occasional and when she has the restless leg syndrome she takes half of the oxycodone.  She denies any pain, denies any numbness no tingling no burning pain in the lower extremity.  She has not tried any medication previously for his restless leg syndrome.    OTHER MEDICAL CONDITIONS: RLS, Hypertension, Chronic back pain and joint pain.    REVIEW OF SYSTEMS: Full 14 system review of systems performed and negative with exception of: As noted in the HPI   ALLERGIES: Allergies  Allergen Reactions   Shellfish Allergy Swelling   Codeine Nausea And Vomiting   Sulfa Antibiotics Nausea And Vomiting   Angiotensin Receptor Blockers Cough    HOME MEDICATIONS: Outpatient Medications Prior to Visit  Medication Sig Dispense Refill   amLODipine (NORVASC) 5 MG tablet Take 1 tablet (5 mg total) by mouth daily. 90 tablet 3   atenolol (TENORMIN) 50 MG tablet Take 50 mg by mouth 2 (two) times daily.  12   BIOTIN PO Take by mouth.     CALCIUM GLUCONATE PO Take by mouth.     mirtazapine (REMERON) 15 MG tablet 3 TABLETS AT BEDTIME ORALLY 90 DAYS  1   Oxycodone HCl 20 MG TABS Take 1 tablet by mouth 3 (three) times daily as needed.      PARoxetine (PAXIL) 20 MG tablet Take 20 mg by mouth daily.     simvastatin (ZOCOR) 40 MG tablet TAKE 1 TABLET BY MOUTH EVERY EVENING ONCE A DAY  5   valsartan-hydrochlorothiazide (DIOVAN-HCT) 320-12.5 MG tablet Take 1 tablet by mouth daily.     cephALEXin (KEFLEX) 500 MG capsule Take 1 capsule (500 mg total) by mouth 3 (three) times daily. (Patient not taking: Reported on 10/05/2021) 20 capsule 0   ciprofloxacin (CIPRO) 500 MG tablet Take 1 tablet (500 mg total) by mouth every 12 (twelve) hours. (Patient not taking: Reported on 10/05/2021) 14 tablet 0   irbesartan-hydrochlorothiazide (AVALIDE) 300-12.5 MG tablet Take 1 tablet by mouth daily. (Patient not taking: Reported on 10/05/2021)  9   ondansetron (ZOFRAN) 4 MG tablet Take 1 tablet (4 mg total) by mouth every 6 (six) hours. 12 tablet 0   prednisoLONE acetate (PRED FORTE) 1 % ophthalmic suspension INSTILL 1 DROP INTO RIGHT EYE EVERY OTHER DAY (Patient not taking: Reported on 10/05/2021)  3   No facility-administered medications prior to visit.    PAST MEDICAL HISTORY: Past Medical History:  Diagnosis Date   Basal cell carcinoma 12/10/2019   right malar cheek mohs   Disorder of bone density and structure, unspecified    Diverticulitis    Dysuria    Fatigue    Heart murmur    Hypercholesterolemia    Hyperlipidemia    Hypertension    Mild mood disorder ([place])  Primary generalized (osteo)arthritis    Sleep disorder    Spinal stenosis of thoracic region    Tension headache     PAST SURGICAL HISTORY: Past Surgical History:  Procedure Laterality Date   ABDOMINAL HYSTERECTOMY     APPENDECTOMY     BLADDER SUSPENSION     TONSILLECTOMY      FAMILY HISTORY: History reviewed. No pertinent family history.  SOCIAL HISTORY: Social History   Socioeconomic History   Marital status: Married    Spouse name: Not on file   Number of children: Not on file   Years of education: Not on file   Highest education level: Not on file   Occupational History   Not on file  Tobacco Use   Smoking status: Never   Smokeless tobacco: Never  Substance and Sexual Activity   Alcohol use: Yes    Alcohol/week: 7.0 standard drinks of alcohol    Types: 7 Glasses of wine per week    Comment: evening wine   Drug use: Not on file   Sexual activity: Not on file  Other Topics Concern   Not on file  Social History Narrative   Not on file   Social Determinants of Health   Financial Resource Strain: Not on file  Food Insecurity: Not on file  Transportation Needs: Not on file  Physical Activity: Not on file  Stress: Not on file  Social Connections: Not on file  Intimate Partner Violence: Not on file    PHYSICAL EXAM  GENERAL EXAM/CONSTITUTIONAL: Vitals:  Vitals:   03/30/22 1055  BP: (!) 145/79  Pulse: 60  Weight: 144 lb (65.3 kg)  Height: '5\' 4"'$  (1.626 m)   Body mass index is 24.72 kg/m. Wt Readings from Last 3 Encounters:  03/30/22 144 lb (65.3 kg)  10/05/21 144 lb (65.3 kg)  05/22/21 138 lb (62.6 kg)   Patient is in no distress; well developed, nourished and groomed; neck is supple  EYES: Pupils round and reactive to light, Visual fields full to confrontation, Extraocular movements intacts,   MUSCULOSKELETAL: Gait, strength, tone, movements noted in Neurologic exam below  NEUROLOGIC: MENTAL STATUS:      No data to display         awake, alert, oriented to person, place and time recent and remote memory intact normal attention and concentration language fluent, comprehension intact, naming intact fund of knowledge appropriate  CRANIAL NERVE:  2nd, 3rd, 4th, 6th - pupils equal and reactive to light, visual fields full to confrontation, extraocular muscles intact, no nystagmus 5th - facial sensation symmetric 7th - facial strength symmetric 8th - hearing intact 9th - palate elevates symmetrically, uvula midline 11th - shoulder shrug symmetric 12th - tongue protrusion midline  MOTOR:  normal  bulk and tone, full strength in the BUE, BLE  SENSORY:  normal and symmetric to light touch and pinprick but decrease vibration up to ankle bilaterally  COORDINATION:  finger-nose-finger, fine finger movements normal  REFLEXES:  deep tendon reflexes present and symmetric  GAIT/STATION:  normal    DIAGNOSTIC DATA (LABS, IMAGING, TESTING) - I reviewed patient records, labs, notes, testing and imaging myself where available.  Lab Results  Component Value Date   WBC 8.5 06/15/2019   HGB 13.9 06/15/2019   HCT 40.6 06/15/2019   MCV 92.5 06/15/2019   PLT 199 06/15/2019      Component Value Date/Time   NA 138 06/15/2019 1326   K 3.3 (L) 06/15/2019 1326   CL 99 06/15/2019 1326  CO2 28 06/15/2019 1326   GLUCOSE 101 (H) 06/15/2019 1326   BUN 12 06/15/2019 1326   CREATININE 0.73 06/15/2019 1326   CALCIUM 9.6 06/15/2019 1326   PROT 7.4 08/22/2013 1530   ALBUMIN 3.9 08/22/2013 1530   AST 19 08/22/2013 1530   ALT 13 08/22/2013 1530   ALKPHOS 77 08/22/2013 1530   BILITOT 0.5 08/22/2013 1530   GFRNONAA >60 06/15/2019 1326   GFRAA >60 06/15/2019 1326   No results found for: "CHOL", "HDL", "LDLCALC", "LDLDIRECT", "TRIG", "CHOLHDL" No results found for: "HGBA1C" Lab Results  Component Value Date   VITAMINB12 448 01/27/2008   No results found for: "TSH"     ASSESSMENT AND PLAN  88 y.o. year old female with history of hypertension, hyperlipidemia, chronic back pain on opioid, restless leg syndrome who is presenting for management.  Patient is currently taking half of opioid the night that she have restless leg syndrome.  I advised her to stop opioid for her RLS and to start pregabalin.  I will also obtain iron studies with vitamin B12 level.  I will contact the patient to go over the result.  Advised her to contact me if the pregabalin 50 mg is not enough, I will increase the dose.  She voiced understanding.  Continue to follow with PCP and return in 6 months or sooner if worse     1. RLS (restless legs syndrome)   2. Large fiber neuropathy      Patient Instructions  Will check Iron studies and Vitamin B12 level today  Start Pregabalin 50 mg nightly  Please contact me if symptoms not controlled  Follow up in 6 months or sooner if worse    Orders Placed This Encounter  Procedures   Iron, TIBC and Ferritin Panel   Vitamin B12    Meds ordered this encounter  Medications   pregabalin (LYRICA) 50 MG capsule    Sig: Take 1 capsule (50 mg total) by mouth at bedtime.    Dispense:  30 capsule    Refill:  5    Return in about 6 months (around 09/28/2022).  I have spent a total of 47 minutes dedicated to this patient today, preparing to see patient, performing a medically appropriate examination and evaluation, ordering tests and/or medications and procedures, and counseling and educating the patient/family/caregiver; independently interpreting result and communicating results to the family/patient/caregiver; and documenting clinical information in the electronic medical record.    Alric Ran, MD 03/30/2022, 4:43 PM  [facility] [address] [place], [place] [address] [phone]

## 2022-03-30 NOTE — Patient Instructions (Addendum)
Will check Iron studies and Vitamin B12 level today  Start Pregabalin 50 mg nightly  Please contact me if symptoms not controlled  Follow up in 6 months or sooner if worse

## 2022-03-31 ENCOUNTER — Telehealth: Payer: Self-pay

## 2022-03-31 DIAGNOSIS — C44319 Basal cell carcinoma of skin of other parts of face: Secondary | ICD-10-CM | POA: Diagnosis not present

## 2022-03-31 DIAGNOSIS — L988 Other specified disorders of the skin and subcutaneous tissue: Secondary | ICD-10-CM | POA: Diagnosis not present

## 2022-03-31 DIAGNOSIS — D485 Neoplasm of uncertain behavior of skin: Secondary | ICD-10-CM | POA: Diagnosis not present

## 2022-03-31 LAB — IRON,TIBC AND FERRITIN PANEL
Ferritin: 1925 ng/mL — ABNORMAL HIGH (ref 15–150)
Iron Saturation: 31 % (ref 15–55)
Iron: 79 ug/dL (ref 27–139)
Total Iron Binding Capacity: 258 ug/dL (ref 250–450)
UIBC: 179 ug/dL (ref 118–369)

## 2022-03-31 LAB — VITAMIN B12: Vitamin B-12: 1141 pg/mL (ref 232–1245)

## 2022-03-31 NOTE — Telephone Encounter (Signed)
Contacted pt, informed her recent Iron studies and Vitamin B12 level within normal limits. No further action is required on these tests at this time.reminded patient to keep any upcoming appointments or tests and to call us with any interim questions, concerns, problems or updates  she was appreciative.

## 2022-03-31 NOTE — Progress Notes (Signed)
Please call and advise the patient that the recent Iron studies and Vitamin B12 level within normal limits. No further action is required on these tests at this time. Please remind patient to keep any upcoming appointments or tests and to call us with any interim questions, concerns, problems or updates. Thanks,   Alric Ran, MD

## 2022-03-31 NOTE — Telephone Encounter (Signed)
-----   Message from Alric Ran, MD sent at 03/31/2022  7:30 AM EST ----- Please call and advise the patient that the recent Iron studies and Vitamin B12 level within normal limits. No further action is required on these tests at this time. Please remind patient to keep any upcoming appointments or tests and to call us with any interim questions, concerns, problems or updates. Thanks,   Alric Ran, MD

## 2022-04-02 ENCOUNTER — Ambulatory Visit
Admission: RE | Admit: 2022-04-02 | Discharge: 2022-04-02 | Disposition: A | Discharge status: Home / Self Care | Payer: Medicare Other | Source: Ambulatory Visit | Attending: Family Medicine | Admitting: Family Medicine

## 2022-04-02 DIAGNOSIS — Z1231 Encounter for screening mammogram for malignant neoplasm of breast: Secondary | ICD-10-CM | POA: Diagnosis not present

## 2022-04-04 DIAGNOSIS — M1711 Unilateral primary osteoarthritis, right knee: Secondary | ICD-10-CM | POA: Diagnosis not present

## 2022-04-22 DIAGNOSIS — M47896 Other spondylosis, lumbar region: Secondary | ICD-10-CM | POA: Diagnosis not present

## 2022-04-22 DIAGNOSIS — M5416 Radiculopathy, lumbar region: Secondary | ICD-10-CM | POA: Diagnosis not present

## 2022-04-22 DIAGNOSIS — M961 Postlaminectomy syndrome, not elsewhere classified: Secondary | ICD-10-CM | POA: Diagnosis not present

## 2022-04-22 DIAGNOSIS — Z5181 Encounter for therapeutic drug level monitoring: Secondary | ICD-10-CM | POA: Diagnosis not present

## 2022-04-22 DIAGNOSIS — M5136 Other intervertebral disc degeneration, lumbar region: Secondary | ICD-10-CM | POA: Diagnosis not present

## 2022-04-29 DIAGNOSIS — C44319 Basal cell carcinoma of skin of other parts of face: Secondary | ICD-10-CM | POA: Diagnosis not present

## 2022-05-11 DIAGNOSIS — M859 Disorder of bone density and structure, unspecified: Secondary | ICD-10-CM | POA: Diagnosis not present

## 2022-05-11 DIAGNOSIS — E7800 Pure hypercholesterolemia, unspecified: Secondary | ICD-10-CM | POA: Diagnosis not present

## 2022-05-11 DIAGNOSIS — C44320 Squamous cell carcinoma of skin of unspecified parts of face: Secondary | ICD-10-CM | POA: Diagnosis not present

## 2022-05-11 DIAGNOSIS — M150 Primary generalized (osteo)arthritis: Secondary | ICD-10-CM | POA: Diagnosis not present

## 2022-05-11 DIAGNOSIS — G479 Sleep disorder, unspecified: Secondary | ICD-10-CM | POA: Diagnosis not present

## 2022-05-11 DIAGNOSIS — R946 Abnormal results of thyroid function studies: Secondary | ICD-10-CM | POA: Diagnosis not present

## 2022-05-11 DIAGNOSIS — M4804 Spinal stenosis, thoracic region: Secondary | ICD-10-CM | POA: Diagnosis not present

## 2022-05-11 DIAGNOSIS — G2581 Restless legs syndrome: Secondary | ICD-10-CM | POA: Diagnosis not present

## 2022-05-11 DIAGNOSIS — I10 Essential (primary) hypertension: Secondary | ICD-10-CM | POA: Diagnosis not present

## 2022-05-11 DIAGNOSIS — G629 Polyneuropathy, unspecified: Secondary | ICD-10-CM | POA: Diagnosis not present

## 2022-06-05 DIAGNOSIS — I350 Nonrheumatic aortic (valve) stenosis: Secondary | ICD-10-CM | POA: Diagnosis not present

## 2022-08-05 ENCOUNTER — Other Ambulatory Visit: Payer: Self-pay | Admitting: Cardiovascular Disease

## 2022-08-05 DIAGNOSIS — I10 Essential (primary) hypertension: Secondary | ICD-10-CM

## 2022-09-09 DIAGNOSIS — M5416 Radiculopathy, lumbar region: Secondary | ICD-10-CM | POA: Diagnosis not present

## 2022-09-09 DIAGNOSIS — Z5181 Encounter for therapeutic drug level monitoring: Secondary | ICD-10-CM | POA: Diagnosis not present

## 2022-09-09 DIAGNOSIS — M47896 Other spondylosis, lumbar region: Secondary | ICD-10-CM | POA: Diagnosis not present

## 2022-09-09 DIAGNOSIS — G2581 Restless legs syndrome: Secondary | ICD-10-CM | POA: Diagnosis not present

## 2022-09-09 DIAGNOSIS — M5136 Other intervertebral disc degeneration, lumbar region: Secondary | ICD-10-CM | POA: Diagnosis not present

## 2022-09-09 DIAGNOSIS — M48062 Spinal stenosis, lumbar region with neurogenic claudication: Secondary | ICD-10-CM | POA: Diagnosis not present

## 2022-09-09 DIAGNOSIS — M961 Postlaminectomy syndrome, not elsewhere classified: Secondary | ICD-10-CM | POA: Diagnosis not present

## 2022-09-09 DIAGNOSIS — Z79899 Other long term (current) drug therapy: Secondary | ICD-10-CM | POA: Diagnosis not present

## 2022-09-29 ENCOUNTER — Other Ambulatory Visit: Payer: Self-pay | Admitting: Neurology

## 2022-09-29 NOTE — Telephone Encounter (Signed)
Requested Prescriptions   Pending Prescriptions Disp Refills   pregabalin (LYRICA) 50 MG capsule [Pharmacy Med Name: PREGABALIN 50 MG CAPSULE] 30 capsule     Sig: TAKE 1 CAPSULE BY MOUTH AT BEDTIME.   Last seen 11/7/23by Teresa Coombs, next appt scheduled 10/11/22. Routing to provider to fill. Pregabalin Adherence  Dispenses   Dispensed Days Supply Quantity Provider Pharmacy  PREGABALIN 50 MG CAPSULE 08/31/2022 30 30 each Windell Norfolk, MD [facility] [phone] - [facility]...  PREGABALIN 50 MG CAPSULE 07/29/2022 30 30 each Windell Norfolk, MD [facility] [phone] - G...  PREGABALIN 50 MG CAPSULE 06/29/2022 30 30 each Windell Norfolk, MD [facility] [phone] - G...  PREGABALIN 50 MG CAPSULE 05/30/2022 30 30 each Windell Norfolk, MD [facility] [phone] - G...  PREGABALIN 50 MG CAPSULE 04/27/2022 30 30 each Windell Norfolk, MD [facility] [phone] - G...  PREGABALIN 50 MG CAPSULE 03/30/2022 30 30 each Windell Norfolk, MD [facility] [phone] - G.Marland KitchenMarland Kitchen

## 2022-10-11 ENCOUNTER — Ambulatory Visit: Payer: Medicare Other | Admitting: Neurology

## 2022-10-11 ENCOUNTER — Encounter: Payer: Self-pay | Admitting: Neurology

## 2022-10-11 VITALS — BP 145/64 | HR 65 | Ht 64.0 in | Wt 148.0 lb

## 2022-10-11 DIAGNOSIS — G2581 Restless legs syndrome: Secondary | ICD-10-CM

## 2022-10-11 NOTE — Progress Notes (Signed)
[facility]  PATIENT: Pamela Robinson DOB: 1932/06/11  REQUESTING CLINICIAN: No ref. provider found HISTORY FROM: Patient and Son   REASON FOR VISIT: Restless leg syndrome    HISTORICAL  CHIEF COMPLAINT:  Chief Complaint  Patient presents with   Follow-up    Rm 12, son (grant ) present  RLS/NEUROPATHY: seems better since she started the medication pregabalin   INTERVAL HISTORY 10/11/2022:  Patient presents today for follow-up, she is accompanied by her son.  She reports the Lyrica has helped her with her restless leg syndrome.  At last visit we also check her B12 and iron studies and hey were all normal.  Currently she reports that her symptoms are well controlled.  No other concerns. Unfortunately in February she lost her husband, but son is there to help.  She said that he is very helpful.   HISTORY OF PRESENT ILLNESS:  This is a 89 year old gentleman woman past medical history of hypertension hyperlipidemia, restless leg syndrome, chronic back pain on oxycodone who is presenting for evaluation of the restless leg syndrome.  Patient reports having restless leg syndrome for more than 10 years.  She reports that her symptoms are not every night, they are occasional and when she has the restless leg syndrome she takes half of the oxycodone.  She denies any pain, denies any numbness no tingling no burning pain in the lower extremity.  She has not tried any medication previously for his restless leg syndrome.    OTHER MEDICAL CONDITIONS: RLS, Hypertension, Chronic back pain and joint pain.    REVIEW OF SYSTEMS: Full 14 system review of systems performed and negative with exception of: As noted in the HPI   ALLERGIES: Allergies  Allergen Reactions   Shellfish Allergy Swelling    Other Reaction(s): Not available   Codeine Nausea And Vomiting    Other Reaction(s): Not available, Unknown   Sulfa Antibiotics Nausea And Vomiting    Other Reaction(s): Not  available  Other Reaction(s): Unknown   Angiotensin Receptor Blockers Cough    HOME MEDICATIONS: Outpatient Medications Prior to Visit  Medication Sig Dispense Refill   amLODipine (NORVASC) 5 MG tablet Take 1 tablet (5 mg total) by mouth daily. Please call [phone] to schedule a May appointment for future refills. Thank you. 90 tablet 0   atenolol (TENORMIN) 50 MG tablet Take 50 mg by mouth 2 (two) times daily.  12   BIOTIN PO Take by mouth.     CALCIUM GLUCONATE PO Take by mouth.     mirtazapine (REMERON) 15 MG tablet 3 TABLETS AT BEDTIME ORALLY 90 DAYS  1   Oxycodone HCl 20 MG TABS Take 1 tablet by mouth 3 (three) times daily as needed.     PARoxetine (PAXIL) 20 MG tablet Take 20 mg by mouth daily.     pregabalin (LYRICA) 50 MG capsule TAKE 1 CAPSULE BY MOUTH AT BEDTIME. 30 capsule 5   simvastatin (ZOCOR) 40 MG tablet TAKE 1 TABLET BY MOUTH EVERY EVENING ONCE A DAY  5   valsartan-hydrochlorothiazide (DIOVAN-HCT) 320-12.5 MG tablet Take 1 tablet by mouth daily.     No facility-administered medications prior to visit.    PAST MEDICAL HISTORY: Past Medical History:  Diagnosis Date   Basal cell carcinoma 12/10/2019   right malar cheek mohs   Disorder of bone density and structure, unspecified    Diverticulitis    Dysuria    Fatigue    Heart murmur    Hypercholesterolemia  Hyperlipidemia    Hypertension    Mild mood disorder (HCC)    Primary generalized (osteo)arthritis    Sleep disorder    Spinal stenosis of thoracic region    Tension headache     PAST SURGICAL HISTORY: Past Surgical History:  Procedure Laterality Date   ABDOMINAL HYSTERECTOMY     APPENDECTOMY     BLADDER SUSPENSION     TONSILLECTOMY      FAMILY HISTORY: History reviewed. No pertinent family history.  SOCIAL HISTORY: Social History   Socioeconomic History   Marital status: Widowed    Spouse name: Not on file   Number of children: 1   Years of education: Not on file   Highest  education level: Not on file  Occupational History   Not on file  Tobacco Use   Smoking status: Never   Smokeless tobacco: Never  Substance and Sexual Activity   Alcohol use: Yes    Alcohol/week: 7.0 standard drinks of alcohol    Types: 7 Glasses of wine per week    Comment: evening wine   Drug use: Not Currently   Sexual activity: Not Currently  Other Topics Concern   Not on file  Social History Narrative   Not on file   Social Determinants of Health   Financial Resource Strain: Not on file  Food Insecurity: Not on file  Transportation Needs: Not on file  Physical Activity: Not on file  Stress: Not on file  Social Connections: Not on file  Intimate Partner Violence: Not on file    PHYSICAL EXAM  GENERAL EXAM/CONSTITUTIONAL: Vitals:  Vitals:   10/11/22 1147  BP: (!) 145/64  Pulse: 65  Weight: 148 lb (67.1 kg)  Height: 5\' 4"  (1.626 m)    Body mass index is 25.4 kg/m. Wt Readings from Last 3 Encounters:  10/11/22 148 lb (67.1 kg)  03/30/22 144 lb (65.3 kg)  10/05/21 144 lb (65.3 kg)   Patient is in no distress; well developed, nourished and groomed; neck is supple  MUSCULOSKELETAL: Gait, strength, tone, movements noted in Neurologic exam below  NEUROLOGIC: MENTAL STATUS:      No data to display         awake, alert, oriented to person, place and time recent and remote memory intact normal attention and concentration language fluent, comprehension intact, naming intact fund of knowledge appropriate  CRANIAL NERVE:  2nd, 3rd, 4th, 6th - visual fields full to confrontation, extraocular muscles intact, no nystagmus 5th - facial sensation symmetric 7th - facial strength symmetric 8th - hearing intact 9th - palate elevates symmetrically, uvula midline 11th - shoulder shrug symmetric 12th - tongue protrusion midline  MOTOR:  normal bulk and tone, full strength in the BUE, BLE  SENSORY:  normal and symmetric to light touch and pinprick but  decrease vibration up to ankle bilaterally  COORDINATION:  finger-nose-finger, fine finger movements normal  GAIT/STATION:  Antalgic, walks with a walker     DIAGNOSTIC DATA (LABS, IMAGING, TESTING) - I reviewed patient records, labs, notes, testing and imaging myself where available.  Lab Results  Component Value Date   WBC 8.5 06/15/2019   HGB 13.9 06/15/2019   HCT 40.6 06/15/2019   MCV 92.5 06/15/2019   PLT 199 06/15/2019      Component Value Date/Time   NA 138 06/15/2019 1326   K 3.3 (L) 06/15/2019 1326   CL 99 06/15/2019 1326   CO2 28 06/15/2019 1326   GLUCOSE 101 (H) 06/15/2019 1326   BUN  12 06/15/2019 1326   CREATININE 0.73 06/15/2019 1326   CALCIUM 9.6 06/15/2019 1326   PROT 7.4 08/22/2013 1530   ALBUMIN 3.9 08/22/2013 1530   AST 19 08/22/2013 1530   ALT 13 08/22/2013 1530   ALKPHOS 77 08/22/2013 1530   BILITOT 0.5 08/22/2013 1530   GFRNONAA >60 06/15/2019 1326   GFRAA >60 06/15/2019 1326   No results found for: "CHOL", "HDL", "LDLCALC", "LDLDIRECT", "TRIG", "CHOLHDL" No results found for: "HGBA1C" Lab Results  Component Value Date   VITAMINB12 1,141 03/30/2022   No results found for: "TSH"    ASSESSMENT AND PLAN  89 y.o. year old female with history of hypertension, hyperlipidemia, chronic back pain on opioid, restless leg syndrome who is presenting for management for her RLS.  She is on Lyrica 50 mg nightly and reports good control of her symptoms.  Plan will continue Lyrica 50 mg nightly.  I did advise patient to follow-up with PCP to see if they can refill the Lyrica otherwise she can call for refill.  Return as needed.    1. RLS (restless legs syndrome)     Patient Instructions  Continue with Lyrica 50 mg nightly Continue to follow with PCP Return as needed  No orders of the defined types were placed in this encounter.   No orders of the defined types were placed in this encounter.   Return if symptoms worsen or fail to  improve.     Windell Norfolk, MD 10/11/2022, 12:08 PM  [facility] [address] [place], [place] [address] [phone]

## 2022-10-11 NOTE — Patient Instructions (Signed)
Continue with Lyrica 50 mg nightly Continue to follow with PCP Return as needed

## 2022-10-20 ENCOUNTER — Other Ambulatory Visit: Payer: Self-pay | Admitting: Cardiovascular Disease

## 2022-10-20 DIAGNOSIS — I10 Essential (primary) hypertension: Secondary | ICD-10-CM

## 2022-11-09 DIAGNOSIS — M5416 Radiculopathy, lumbar region: Secondary | ICD-10-CM | POA: Diagnosis not present

## 2022-12-03 DIAGNOSIS — C44319 Basal cell carcinoma of skin of other parts of face: Secondary | ICD-10-CM | POA: Diagnosis not present

## 2022-12-24 DIAGNOSIS — M4804 Spinal stenosis, thoracic region: Secondary | ICD-10-CM | POA: Diagnosis not present

## 2022-12-24 DIAGNOSIS — C44320 Squamous cell carcinoma of skin of unspecified parts of face: Secondary | ICD-10-CM | POA: Diagnosis not present

## 2022-12-24 DIAGNOSIS — G479 Sleep disorder, unspecified: Secondary | ICD-10-CM | POA: Diagnosis not present

## 2022-12-24 DIAGNOSIS — M150 Primary generalized (osteo)arthritis: Secondary | ICD-10-CM | POA: Diagnosis not present

## 2022-12-24 DIAGNOSIS — Z0000 Encounter for general adult medical examination without abnormal findings: Secondary | ICD-10-CM | POA: Diagnosis not present

## 2022-12-24 DIAGNOSIS — R011 Cardiac murmur, unspecified: Secondary | ICD-10-CM | POA: Diagnosis not present

## 2022-12-24 DIAGNOSIS — E7800 Pure hypercholesterolemia, unspecified: Secondary | ICD-10-CM | POA: Diagnosis not present

## 2022-12-24 DIAGNOSIS — G629 Polyneuropathy, unspecified: Secondary | ICD-10-CM | POA: Diagnosis not present

## 2022-12-24 DIAGNOSIS — I10 Essential (primary) hypertension: Secondary | ICD-10-CM | POA: Diagnosis not present

## 2023-01-22 ENCOUNTER — Other Ambulatory Visit: Payer: Self-pay | Admitting: Cardiovascular Disease

## 2023-01-22 DIAGNOSIS — I10 Essential (primary) hypertension: Secondary | ICD-10-CM

## 2023-02-19 ENCOUNTER — Other Ambulatory Visit: Payer: Self-pay | Admitting: Cardiovascular Disease

## 2023-02-19 DIAGNOSIS — I10 Essential (primary) hypertension: Secondary | ICD-10-CM

## 2023-03-06 ENCOUNTER — Other Ambulatory Visit: Payer: Self-pay | Admitting: Cardiovascular Disease

## 2023-03-06 DIAGNOSIS — I10 Essential (primary) hypertension: Secondary | ICD-10-CM

## 2023-03-23 ENCOUNTER — Other Ambulatory Visit: Payer: Self-pay | Admitting: Neurology

## 2023-03-23 ENCOUNTER — Other Ambulatory Visit: Payer: Self-pay | Admitting: Cardiovascular Disease

## 2023-03-23 DIAGNOSIS — I10 Essential (primary) hypertension: Secondary | ICD-10-CM

## 2023-03-23 NOTE — Telephone Encounter (Signed)
Requested Prescriptions   Pending Prescriptions Disp Refills   pregabalin (LYRICA) 50 MG capsule [Pharmacy Med Name: PREGABALIN 50 MG CAPSULE] 30 capsule     Sig: TAKE 1 CAPSULE BY MOUTH EVERYDAY AT BEDTIME   Last seen 10/11/22 Next appt not scheduled  Dispenses   Dispensed Days Supply Quantity Provider Pharmacy  PREGABALIN 50 MG CAPSULE 02/26/2023 30 30 each Windell Norfolk, MD [facility] [phone] - [facility]...  PREGABALIN 50 MG CAPSULE 01/28/2023 30 30 each Windell Norfolk, MD [facility] [phone] - [facility]...  PREGABALIN 50 MG CAPSULE 12/29/2022 30 30 each Windell Norfolk, MD [facility] [phone] - [facility]...  PREGABALIN 50 MG CAPSULE 11/29/2022 30 30 each Windell Norfolk, MD [facility] [phone] - [facility]...  PREGABALIN 50 MG CAPSULE 10/29/2022 30 30 each Windell Norfolk, MD [facility] [phone] - [facility]...  PREGABALIN 50 MG CAPSULE 09/29/2022 30 30 each Windell Norfolk, MD [facility] [phone] - [facility]...  PREGABALIN 50 MG CAPSULE 08/31/2022 30 30 each Windell Norfolk, MD [facility] [phone] - [facility]...  PREGABALIN 50 MG CAPSULE 07/29/2022 30 30 each Windell Norfolk, MD [facility] [phone] - [facility]...  PREGABALIN 50 MG CAPSULE 06/29/2022 30 30 each Windell Norfolk, MD [facility] [phone] - [facility]...  PREGABALIN 50 MG CAPSULE 05/30/2022 30 30 each Windell Norfolk, MD [facility] [phone] - G...  PREGABALIN 50 MG CAPSULE 04/27/2022 30 30 each Windell Norfolk, MD [facility] [phone] - G...  PREGABALIN 50 MG CAPSULE 03/30/2022 30 30 each Windell Norfolk, MD [facility] [phone] - G.Marland KitchenMarland Kitchen

## 2023-04-14 ENCOUNTER — Other Ambulatory Visit: Payer: Self-pay | Admitting: Cardiovascular Disease

## 2023-04-14 DIAGNOSIS — I10 Essential (primary) hypertension: Secondary | ICD-10-CM

## 2023-05-27 DIAGNOSIS — E7800 Pure hypercholesterolemia, unspecified: Secondary | ICD-10-CM | POA: Diagnosis not present

## 2023-05-27 DIAGNOSIS — M4804 Spinal stenosis, thoracic region: Secondary | ICD-10-CM | POA: Diagnosis not present

## 2023-05-27 DIAGNOSIS — I739 Peripheral vascular disease, unspecified: Secondary | ICD-10-CM | POA: Diagnosis not present

## 2023-05-27 DIAGNOSIS — Z23 Encounter for immunization: Secondary | ICD-10-CM | POA: Diagnosis not present

## 2023-05-27 DIAGNOSIS — M7989 Other specified soft tissue disorders: Secondary | ICD-10-CM | POA: Diagnosis not present

## 2023-05-27 DIAGNOSIS — G479 Sleep disorder, unspecified: Secondary | ICD-10-CM | POA: Diagnosis not present

## 2023-05-27 DIAGNOSIS — R7301 Impaired fasting glucose: Secondary | ICD-10-CM | POA: Diagnosis not present

## 2023-05-27 DIAGNOSIS — I10 Essential (primary) hypertension: Secondary | ICD-10-CM | POA: Diagnosis not present

## 2023-05-27 DIAGNOSIS — N1832 Chronic kidney disease, stage 3b: Secondary | ICD-10-CM | POA: Diagnosis not present

## 2023-05-30 ENCOUNTER — Other Ambulatory Visit: Payer: Self-pay | Admitting: Internal Medicine

## 2023-05-30 DIAGNOSIS — I739 Peripheral vascular disease, unspecified: Secondary | ICD-10-CM

## 2023-06-01 ENCOUNTER — Ambulatory Visit: Payer: Medicare Other | Admitting: Cardiovascular Disease

## 2023-06-01 DIAGNOSIS — D485 Neoplasm of uncertain behavior of skin: Secondary | ICD-10-CM | POA: Diagnosis not present

## 2023-06-01 DIAGNOSIS — C44529 Squamous cell carcinoma of skin of other part of trunk: Secondary | ICD-10-CM | POA: Diagnosis not present

## 2023-06-02 ENCOUNTER — Other Ambulatory Visit: Payer: Self-pay | Admitting: Cardiovascular Disease

## 2023-06-02 DIAGNOSIS — I10 Essential (primary) hypertension: Secondary | ICD-10-CM

## 2023-06-03 ENCOUNTER — Other Ambulatory Visit: Payer: Medicare Other

## 2023-06-03 NOTE — Telephone Encounter (Signed)
 Pt has not been seen since 2023. Please advise thank you

## 2023-06-06 ENCOUNTER — Ambulatory Visit: Payer: Medicare Other | Attending: Cardiovascular Disease | Admitting: Cardiovascular Disease

## 2023-06-06 ENCOUNTER — Encounter: Payer: Self-pay | Admitting: Cardiovascular Disease

## 2023-06-06 VITALS — BP 138/68 | HR 60 | Ht 63.0 in | Wt 142.0 lb

## 2023-06-06 DIAGNOSIS — I350 Nonrheumatic aortic (valve) stenosis: Secondary | ICD-10-CM

## 2023-06-06 NOTE — Patient Instructions (Signed)
 Testing/Procedures: ECHO (in 1 year, prior to visit) Your physician has requested that you have an echocardiogram. Echocardiography is a painless test that uses sound waves to create images of your heart. It provides your doctor with information about the size and shape of your heart and how well your heart's chambers and valves are working. This procedure takes approximately one hour. There are no restrictions for this procedure. Please do NOT wear cologne, perfume, aftershave, or lotions (deodorant is allowed). Please arrive 15 minutes prior to your appointment time.  Please note: We ask at that you not bring children with you during ultrasound (echo/ vascular) testing. Due to room size and safety concerns, children are not allowed in the ultrasound rooms during exams. Our front office staff cannot provide observation of children in our lobby area while testing is being conducted. An adult accompanying a patient to their appointment will only be allowed in the ultrasound room at the discretion of the ultrasound technician under special circumstances. We apologize for any inconvenience.  Follow-Up: At [facility], you and your health needs are our priority.  As part of our continuing mission to provide you with exceptional heart care, we have created designated Provider Care Teams.  These Care Teams include your primary Cardiologist (physician) and Advanced Practice Providers (APPs -  Physician Assistants and Nurse Practitioners) who all work together to provide you with the care you need, when you need it.  We recommend signing up for the patient portal called MyChart.  Sign up information is provided on this After Visit Summary.  MyChart is used to connect with patients for Virtual Visits (Telemedicine).  Patients are able to view lab/test results, encounter notes, upcoming appointments, etc.  Non-urgent messages can be sent to your provider as well.   To learn more about what you can do  with MyChart, go to [email or web address].    Your next appointment:   1 year(s)  Provider:   Ozell Fell, MD

## 2023-06-06 NOTE — Progress Notes (Signed)
 Cardiology Office Note:    Date:  06/06/2023   ID:  Dickey Lucio Laundry, DOB Oct 19, 1932, MRN [id number]  PCP:  Vernon Velna SAUNDERS, MD   [facility] Providers Cardiologist:  Ozell Fell, MD     Referring MD: Vernon Velna SAUNDERS, MD   Chief Complaint  Patient presents with   Aortic Stenosis    History of Present Illness:    Pamela Robinson is a 89 y.o. female presenting for follow-up of aortic stenosis.  I saw the patient in May 2023 for initial evaluation of cardiac murmur.  She is here with her son today.  Her husband died last year and they were married for over 60 years.  She has had a difficult time with this.  She is starting to feel better and looks forward to being more active this coming year.  She denies any symptoms of chest pain, chest pressure, shortness of breath, lightheadedness, or progressive fatigue.  She underwent an echocardiogram in 2023 which showed normal LVEF and mild aortic stenosis.  She has had no follow-up imaging done since that time.   Current Medications: Current Meds  Medication Sig   amLODipine  (NORVASC ) 5 MG tablet TAKE 1 TABLET (5 MG TOTAL) BY MOUTH DAILY.   atenolol  (TENORMIN ) 50 MG tablet Take 50 mg by mouth 2 (two) times daily.   BIOTIN PO Take by mouth.   CALCIUM GLUCONATE PO Take by mouth.   DULoxetine  (CYMBALTA ) 60 MG capsule Take 60 mg by mouth daily.   mirtazapine  (REMERON ) 15 MG tablet 3 TABLETS AT BEDTIME ORALLY 90 DAYS   Oxycodone  HCl 20 MG TABS Take 1 tablet by mouth 3 (three) times daily as needed.   PARoxetine (PAXIL) 20 MG tablet Take 20 mg by mouth daily.   pregabalin  (LYRICA ) 50 MG capsule TAKE 1 CAPSULE BY MOUTH EVERYDAY AT BEDTIME   simvastatin  (ZOCOR ) 40 MG tablet TAKE 1 TABLET BY MOUTH EVERY EVENING ONCE A DAY   valsartan-hydrochlorothiazide (DIOVAN-HCT) 320-12.5 MG tablet Take 1 tablet by mouth daily.     Allergies:   Shellfish allergy, Codeine, Sulfa antibiotics, and Angiotensin receptor blockers   ROS:    Please see the history of present illness.    All other systems reviewed and are negative.  EKGs/Labs/Other Studies Reviewed:    The following studies were reviewed today: Cardiac Studies & Procedures      ECHOCARDIOGRAM  ECHOCARDIOGRAM COMPLETE 10/13/2021  Narrative ECHOCARDIOGRAM REPORT    Patient Name:   Pamela Robinson Date of Exam: 10/13/2021 Medical Rec #:  [id number]          Height:       64.0 in Accession #:    [id number]         Weight:       144.0 lb Date of Birth:  1932/06/08         BSA:          1.701 m Patient Age:    88 years           BP:           140/100 mmHg Patient Gender: F                  HR:           59 bpm. Exam Location:  [address]  Procedure: 2D Echo, 3D Echo, Cardiac Doppler, Color Doppler and Strain Analysis  Indications:    I35 Aortic stenosis  History:  Patient has no prior history of Echocardiogram examinations. Signs/Symptoms:Murmur; Risk Factors:Hypertension and Dyslipidemia.  Sonographer:    Marshia Lawyer BS, RDCS Referring Phys: [phone] Orell Hurtado  IMPRESSIONS   1. Left ventricular ejection fraction, by estimation, is 60 to 65%. The left ventricle has normal function. The left ventricle has no regional wall motion abnormalities. There is moderate left ventricular hypertrophy. Left ventricular diastolic parameters are consistent with Grade I diastolic dysfunction (impaired relaxation). Elevated left atrial pressure. The average left ventricular global longitudinal strain is -24.0 %. The global longitudinal strain is normal. 2. Right ventricular systolic function is normal. The right ventricular size is normal. There is mildly elevated pulmonary artery systolic pressure. 3. Left atrial size was moderately dilated. 4. Right atrial size was mildly dilated. 5. The mitral valve is normal in structure. Mild mitral valve regurgitation. No evidence of mitral stenosis. Moderate mitral annular calcification. 6. Tricuspid valve  regurgitation is moderate. 7. The aortic valve is tricuspid. Aortic valve regurgitation is mild. Mild aortic valve stenosis. 8. The inferior vena cava is normal in size with greater than 50% respiratory variability, suggesting right atrial pressure of 3 mmHg.  Comparison(s): No prior Echocardiogram.  FINDINGS Left Ventricle: Left ventricular ejection fraction, by estimation, is 60 to 65%. The left ventricle has normal function. The left ventricle has no regional wall motion abnormalities. The average left ventricular global longitudinal strain is -24.0 %. The global longitudinal strain is normal. The left ventricular internal cavity size was normal in size. There is moderate left ventricular hypertrophy. Left ventricular diastolic parameters are consistent with Grade I diastolic dysfunction (impaired relaxation). Elevated left atrial pressure.  Right Ventricle: The right ventricular size is normal. Right ventricular systolic function is normal. There is mildly elevated pulmonary artery systolic pressure. The tricuspid regurgitant velocity is 2.97 m/s, and with an assumed right atrial pressure of 3 mmHg, the estimated right ventricular systolic pressure is 38.3 mmHg.  Left Atrium: Left atrial size was moderately dilated.  Right Atrium: Right atrial size was mildly dilated.  Pericardium: There is no evidence of pericardial effusion.  Mitral Valve: The mitral valve is normal in structure. Moderate mitral annular calcification. Mild mitral valve regurgitation. No evidence of mitral valve stenosis.  Tricuspid Valve: The tricuspid valve is normal in structure. Tricuspid valve regurgitation is moderate . No evidence of tricuspid stenosis.  Aortic Valve: The aortic valve is tricuspid. Aortic valve regurgitation is mild. Aortic regurgitation PHT measures 485 msec. Mild aortic stenosis is present. Aortic valve mean gradient measures 8.5 mmHg. Aortic valve peak gradient measures 15.3 mmHg. Aortic valve  area, by VTI measures 1.83 cm.  Pulmonic Valve: The pulmonic valve was normal in structure. Pulmonic valve regurgitation is trivial. No evidence of pulmonic stenosis.  Aorta: The aortic root is normal in size and structure.  Venous: The inferior vena cava is normal in size with greater than 50% respiratory variability, suggesting right atrial pressure of 3 mmHg.  IAS/Shunts: No atrial level shunt detected by color flow Doppler.   LEFT VENTRICLE PLAX 2D LVIDd:         2.60 cm   Diastology LVIDs:         1.80 cm   LV e' medial:    3.30 cm/s LV PW:         1.60 cm   LV E/e' medial:  24.0 LV IVS:        1.20 cm   LV e' lateral:   3.23 cm/s LVOT diam:     2.20 cm  LV E/e' lateral: 24.5 LV SV:         88 LV SV Index:   52        2D Longitudinal Strain LVOT Area:     3.80 cm  2D Strain GLS (A2C):   -21.2 % 2D Strain GLS (A3C):   -28.2 % 2D Strain GLS (A4C):   -22.7 % 2D Strain GLS Avg:     -24.0 %  3D Volume EF: 3D EF:        58 % LV EDV:       80 ml LV ESV:       34 ml LV SV:        46 ml  RIGHT VENTRICLE             IVC RV Basal diam:  3.40 cm     IVC diam: 1.60 cm RV S prime:     12.70 cm/s TAPSE (M-mode): 1.8 cm RVSP:           38.3 mmHg  LEFT ATRIUM             Index        RIGHT ATRIUM           Index LA diam:        4.00 cm 2.35 cm/m   RA Pressure: 3.00 mmHg LA Vol (A2C):   61.0 ml 35.85 ml/m  RA Area:     21.40 cm LA Vol (A4C):   76.4 ml 44.90 ml/m  RA Volume:   60.30 ml  35.44 ml/m LA Biplane Vol: 69.9 ml 41.08 ml/m AORTIC VALVE AV Area (Vmax):    1.85 cm AV Area (Vmean):   1.75 cm AV Area (VTI):     1.83 cm AV Vmax:           195.50 cm/s AV Vmean:          133.500 cm/s AV VTI:            0.483 m AV Peak Grad:      15.3 mmHg AV Mean Grad:      8.5 mmHg LVOT Vmax:         94.90 cm/s LVOT Vmean:        61.300 cm/s LVOT VTI:          0.232 m LVOT/AV VTI ratio: 0.48 AI PHT:            485 msec  AORTA Ao Root diam: 3.20 cm Ao Asc diam:  3.10  cm  MITRAL VALVE               TRICUSPID VALVE TR Peak grad:   35.3 mmHg MV Decel Time: 239 msec    TR Vmax:        297.00 cm/s MV E velocity: 79.10 cm/s  Estimated RAP:  3.00 mmHg MV A velocity: 95.40 cm/s  RVSP:           38.3 mmHg MV E/A ratio:  0.83 SHUNTS Systemic VTI:  0.23 m Systemic Diam: 2.20 cm  Redell Shallow MD Electronically signed by Redell Shallow MD Signature Date/Time: 10/13/2021/1:59:04 PM    Final             EKG:   EKG Interpretation Date/Time:  Monday June 06 2023 15:54:10 EST Ventricular Rate:  60 PR Interval:  220 QRS Duration:  126 QT Interval:  484 QTC Calculation: 484 R Axis:   179  Text Interpretation: Sinus rhythm with 1st degree A-V block with  occasional Premature ventricular complexes Non-specific intra-ventricular conduction block Possible Anterolateral infarct , age undetermined When compared with ECG of 17-Jan-2008 10:21, Premature ventricular complexes are now Present Questionable change in QRS duration Borderline criteria for Anterolateral infarct are now Present Suspect changes are due to IVCD conduction disease Confirmed by Wonda Sharper [phone]) on 06/06/2023 4:13:10 PM    Recent Labs: No results found for requested labs within last 365 days.  Recent Lipid Panel No results found for: CHOL, TRIG, HDL, CHOLHDL, VLDL, LDLCALC, LDLDIRECT   Risk Assessment/Calculations:                Physical Exam:    VS:  BP 138/68 (BP Location: Right Arm, Patient Position: Sitting)   Pulse 60   Ht 5' 3 (1.6 m)   Wt 142 lb (64.4 kg)   SpO2 91%   BMI 25.15 kg/m     Wt Readings from Last 3 Encounters:  06/06/23 142 lb (64.4 kg)  10/11/22 148 lb (67.1 kg)  03/30/22 144 lb (65.3 kg)     GEN:  Well nourished, well developed in no acute distress HEENT: Normal NECK: No JVD; No carotid bruits LYMPHATICS: No lymphadenopathy CARDIAC: RRR, 2/6 harsh crescendo decrescendo murmur, mid peaking, at the right upper sternal border  with a 2 present RESPIRATORY:  Clear to auscultation without rales, wheezing or rhonchi  ABDOMEN: Soft, non-tender, non-distended MUSCULOSKELETAL:  No edema; No deformity  SKIN: Warm and dry NEUROLOGIC:  Alert and oriented x 3 PSYCHIATRIC:  Normal affect   Assessment & Plan Nonrheumatic aortic valve stenosis By exam, the patient continues to have mild to moderate aortic stenosis and she is asymptomatic.  We again discussed the expected slow progression of aortic stenosis.  We discussed potential symptoms attributable to this.  She will follow-up in 1 year with a repeat echocardiogram prior to the visit.  Otherwise she will continue her current medications which are reviewed with her today.  She remains functionally independent and is doing quite well at age 52.  Her son lives with her but she is able to care for herself without any assistance.  She will contact us  if any symptoms arise.  Otherwise, I will see her back next year with an echo prior to her visit.     Medication Adjustments/Labs and Tests Ordered: Current medicines are reviewed at length with the patient today.  Concerns regarding medicines are outlined above.  Orders Placed This Encounter  Procedures   EKG 12-Lead   EKG 12-Lead   ECHOCARDIOGRAM COMPLETE   No orders of the defined types were placed in this encounter.   Patient Instructions  Testing/Procedures: ECHO (in 1 year, prior to visit) Your physician has requested that you have an echocardiogram. Echocardiography is a painless test that uses sound waves to create images of your heart. It provides your doctor with information about the size and shape of your heart and how well your heart's chambers and valves are working. This procedure takes approximately one hour. There are no restrictions for this procedure. Please do NOT wear cologne, perfume, aftershave, or lotions (deodorant is allowed). Please arrive 15 minutes prior to your appointment time.  Please note: We  ask at that you not bring children with you during ultrasound (echo/ vascular) testing. Due to room size and safety concerns, children are not allowed in the ultrasound rooms during exams. Our front office staff cannot provide observation of children in our lobby area while testing is being conducted. An adult accompanying a patient to  their appointment will only be allowed in the ultrasound room at the discretion of the ultrasound technician under special circumstances. We apologize for any inconvenience.  Follow-Up: At [facility], you and your health needs are our priority.  As part of our continuing mission to provide you with exceptional heart care, we have created designated Provider Care Teams.  These Care Teams include your primary Cardiologist (physician) and Advanced Practice Providers (APPs -  Physician Assistants and Nurse Practitioners) who all work together to provide you with the care you need, when you need it.  We recommend signing up for the patient portal called MyChart.  Sign up information is provided on this After Visit Summary.  MyChart is used to connect with patients for Virtual Visits (Telemedicine).  Patients are able to view lab/test results, encounter notes, upcoming appointments, etc.  Non-urgent messages can be sent to your provider as well.   To learn more about what you can do with MyChart, go to [email or web address].    Your next appointment:   1 year(s)  Provider:   Ozell Fell, MD             Signed, Ozell Fell, MD  06/06/2023 4:50 PM    [facility]

## 2023-06-07 ENCOUNTER — Ambulatory Visit
Admission: RE | Admit: 2023-06-07 | Discharge: 2023-06-07 | Discharge status: Home / Self Care | Payer: Medicare Other | Source: Ambulatory Visit | Attending: Internal Medicine

## 2023-06-07 ENCOUNTER — Other Ambulatory Visit: Payer: Self-pay | Admitting: Internal Medicine

## 2023-06-07 DIAGNOSIS — I739 Peripheral vascular disease, unspecified: Secondary | ICD-10-CM

## 2023-06-07 DIAGNOSIS — R0989 Other specified symptoms and signs involving the circulatory and respiratory systems: Secondary | ICD-10-CM | POA: Diagnosis not present

## 2023-06-14 ENCOUNTER — Other Ambulatory Visit: Payer: Self-pay | Admitting: Internal Medicine

## 2023-06-14 DIAGNOSIS — Z1231 Encounter for screening mammogram for malignant neoplasm of breast: Secondary | ICD-10-CM

## 2023-06-21 DIAGNOSIS — M48062 Spinal stenosis, lumbar region with neurogenic claudication: Secondary | ICD-10-CM | POA: Diagnosis not present

## 2023-06-21 DIAGNOSIS — M51369 Other intervertebral disc degeneration, lumbar region without mention of lumbar back pain or lower extremity pain: Secondary | ICD-10-CM | POA: Diagnosis not present

## 2023-06-21 DIAGNOSIS — M961 Postlaminectomy syndrome, not elsewhere classified: Secondary | ICD-10-CM | POA: Diagnosis not present

## 2023-06-21 DIAGNOSIS — T402X5A Adverse effect of other opioids, initial encounter: Secondary | ICD-10-CM | POA: Insufficient documentation

## 2023-06-25 ENCOUNTER — Other Ambulatory Visit: Payer: Self-pay | Admitting: Cardiovascular Disease

## 2023-06-25 DIAGNOSIS — I10 Essential (primary) hypertension: Secondary | ICD-10-CM

## 2023-06-28 ENCOUNTER — Ambulatory Visit
Admission: RE | Admit: 2023-06-28 | Discharge: 2023-06-28 | Disposition: A | Discharge status: Home / Self Care | Payer: Medicare Other | Source: Ambulatory Visit | Attending: Internal Medicine

## 2023-06-28 DIAGNOSIS — Z1231 Encounter for screening mammogram for malignant neoplasm of breast: Secondary | ICD-10-CM

## 2023-09-14 DIAGNOSIS — R300 Dysuria: Secondary | ICD-10-CM | POA: Diagnosis not present

## 2023-09-23 DIAGNOSIS — M4804 Spinal stenosis, thoracic region: Secondary | ICD-10-CM | POA: Diagnosis not present

## 2023-09-23 DIAGNOSIS — M859 Disorder of bone density and structure, unspecified: Secondary | ICD-10-CM | POA: Diagnosis not present

## 2023-09-23 DIAGNOSIS — N1832 Chronic kidney disease, stage 3b: Secondary | ICD-10-CM | POA: Diagnosis not present

## 2023-09-23 DIAGNOSIS — G479 Sleep disorder, unspecified: Secondary | ICD-10-CM | POA: Diagnosis not present

## 2023-09-23 DIAGNOSIS — I10 Essential (primary) hypertension: Secondary | ICD-10-CM | POA: Diagnosis not present

## 2023-09-26 ENCOUNTER — Other Ambulatory Visit: Payer: Self-pay | Admitting: Neurology

## 2023-09-26 NOTE — Telephone Encounter (Signed)
 Requested Prescriptions   Pending Prescriptions Disp Refills   pregabalin  (LYRICA ) 50 MG capsule 30 capsule 5    Sig: Take 1 capsule (50 mg total) by mouth at bedtime.   Last seen 10/11/22, next appt not scheduled  Pt needs appt but can get one month refill with instructions on bottle to make appt  Dispenses   Dispensed Days Supply Quantity Provider Pharmacy  PREGABALIN  50 MG CAPSULE 08/27/2023 30 30 each Camara, Amadou, MD [facility] [phone] - G...  PREGABALIN  50 MG CAPSULE 07/29/2023 30 30 each Camara, Amadou, MD [facility] [phone] - G...  PREGABALIN  50 MG CAPSULE 06/26/2023 30 30 each Camara, Amadou, MD [facility] [phone] - G...  PREGABALIN  50 MG CAPSULE 05/26/2023 30 30 each Camara, Amadou, MD [facility] [phone] - G...  PREGABALIN  50 MG CAPSULE 04/28/2023 30 30 each Camara, Amadou, MD [facility] [phone] - G...  PREGABALIN  50 MG CAPSULE 03/26/2023 30 30 each Camara, Amadou, MD [facility] [phone] - G...  PREGABALIN  50 MG CAPSULE 02/26/2023 30 30 each Camara, Amadou, MD [facility] [phone] - G...  PREGABALIN  50 MG CAPSULE 01/28/2023 30 30 each Camara, Amadou, MD [facility] [phone] - G...  PREGABALIN  50 MG CAPSULE 12/29/2022 30 30 each Camara, Amadou, MD [facility] [phone] - G...  PREGABALIN  50 MG CAPSULE 11/29/2022 30 30 each Camara, Amadou, MD [facility] [phone] - G...  PREGABALIN  50 MG CAPSULE 10/29/2022 30 30 each Camara, Amadou, MD [facility] [phone] - G.Aaron AasAaron Aas

## 2023-09-26 NOTE — Addendum Note (Signed)
 Addended by: Randi Buster on: 09/26/2023 04:06 PM   Modules accepted: Orders

## 2023-09-26 NOTE — Telephone Encounter (Signed)
 pregabalin  (LYRICA ) 50 MG changed Pharmacy to [facility] on [address] and wants this prescription sent there.

## 2023-09-27 ENCOUNTER — Other Ambulatory Visit: Payer: Self-pay | Admitting: Neurology

## 2023-09-27 NOTE — Telephone Encounter (Signed)
 Has schedule a 1 year follow up with Camilo Cella for her to be able to get her medication refill for pregabalin  (LYRICA ) 50 MG capsule. Please send refill to  [id number]   Informed patient must come to this appointment

## 2023-09-28 MED ORDER — PREGABALIN 50 MG PO CAPS: 50.0000 mg | ORAL_CAPSULE | Freq: Every evening | ORAL | 0 refills | Status: DC

## 2023-09-28 NOTE — Telephone Encounter (Addendum)
 Pt calling to find out why refill for pregabalin  (LYRICA ) 50 MG capsule has not been sent.  Informed patient there had been an error.  Pt is out of medication. Would like a call from the nurse.

## 2023-09-28 NOTE — Telephone Encounter (Signed)
 Requested Prescriptions   Pending Prescriptions Disp Refills   pregabalin  (LYRICA ) 50 MG capsule 30 capsule 0    Sig: Take 1 capsule (50 mg total) by mouth at bedtime.   Signed Prescriptions Disp Refills   pregabalin  (LYRICA ) 50 MG capsule 30 capsule 0    Sig: TAKE 1 CAPSULE BY MOUTH EVERYDAY AT BEDTIME    Authorizing Provider: Cassandra Cleveland    Ordering User: Randi Buster   I accidentally signed and it printed by my mistake so I am reordering  Last seen 10/11/22 by Dr. Samara Crest and his note stated to continue lyrica   Next appt scheduled 10/19/23 with jessica mccue.   ROUTING TO THE WORK IN PROVIDER TO FILL  Dispenses   Dispensed Days Supply Quantity Provider Pharmacy  PREGABALIN  50 MG CAPSULE 08/27/2023 30 30 each Camara, Amadou, MD [facility] [phone] - G...  PREGABALIN  50 MG CAPSULE 07/29/2023 30 30 each Camara, Amadou, MD [facility] [phone] - G...  PREGABALIN  50 MG CAPSULE 06/26/2023 30 30 each Camara, Amadou, MD [facility] [phone] - G...  PREGABALIN  50 MG CAPSULE 05/26/2023 30 30 each Camara, Amadou, MD [facility] [phone] - G...  PREGABALIN  50 MG CAPSULE 04/28/2023 30 30 each Camara, Amadou, MD [facility] [phone] - G...  PREGABALIN  50 MG CAPSULE 03/26/2023 30 30 each Camara, Amadou, MD [facility] [phone] - G...  PREGABALIN  50 MG CAPSULE 02/26/2023 30 30 each Camara, Amadou, MD [facility] [phone] - G...  PREGABALIN  50 MG CAPSULE 01/28/2023 30 30 each Camara, Amadou, MD [facility] [phone] - G...  PREGABALIN  50 MG CAPSULE 12/29/2022 30 30 each Camara, Amadou, MD [facility] [phone] - G...  PREGABALIN  50 MG CAPSULE 11/29/2022 30 30 each Camara, Amadou, MD [facility] [phone] - G...  PREGABALIN  50 MG CAPSULE 10/29/2022 30 30 each Camara, Amadou, MD [facility] [phone] - G.Aaron AasAaron Aas

## 2023-09-28 NOTE — Addendum Note (Signed)
 Addended by: Fredi January on: 09/28/2023 01:36 PM   Modules accepted: Orders

## 2023-09-28 NOTE — Telephone Encounter (Signed)
 Refill request already sent to Dr.Yan to approve. See refill request for 09/27/23

## 2023-09-28 NOTE — Addendum Note (Signed)
 Addended by: Randi Buster on: 09/28/2023 10:29 AM   Modules accepted: Orders

## 2023-09-28 NOTE — Telephone Encounter (Addendum)
 I called patient and she would like Rx sent to [facility] on [address], not [facility]. I explained that I will route the Rx to Dr.Yan who is the work in provider this afternoon to sign to the correct pharmacy.   Rx pending to be signed

## 2023-09-29 MED ORDER — PREGABALIN 50 MG PO CAPS: 50.0000 mg | ORAL_CAPSULE | Freq: Every day | ORAL | 5 refills | Status: DC

## 2023-10-07 DIAGNOSIS — N1832 Chronic kidney disease, stage 3b: Secondary | ICD-10-CM | POA: Diagnosis not present

## 2023-10-19 ENCOUNTER — Ambulatory Visit: Admitting: Adult Health

## 2023-10-19 DIAGNOSIS — M51369 Other intervertebral disc degeneration, lumbar region without mention of lumbar back pain or lower extremity pain: Secondary | ICD-10-CM | POA: Diagnosis not present

## 2023-10-19 DIAGNOSIS — M961 Postlaminectomy syndrome, not elsewhere classified: Secondary | ICD-10-CM | POA: Diagnosis not present

## 2023-10-19 DIAGNOSIS — K5903 Drug induced constipation: Secondary | ICD-10-CM | POA: Insufficient documentation

## 2023-10-19 DIAGNOSIS — Z79899 Other long term (current) drug therapy: Secondary | ICD-10-CM | POA: Diagnosis not present

## 2023-10-19 DIAGNOSIS — M5416 Radiculopathy, lumbar region: Secondary | ICD-10-CM | POA: Diagnosis not present

## 2023-12-14 ENCOUNTER — Encounter: Payer: Self-pay | Admitting: Emergency Medicine

## 2023-12-14 ENCOUNTER — Ambulatory Visit: Admission: EM | Admit: 2023-12-14 | Discharge: 2023-12-14 | Disposition: A | Discharge status: Home / Self Care

## 2023-12-14 DIAGNOSIS — K1120 Sialoadenitis, unspecified: Secondary | ICD-10-CM | POA: Diagnosis not present

## 2023-12-14 DIAGNOSIS — C44529 Squamous cell carcinoma of skin of other part of trunk: Secondary | ICD-10-CM | POA: Insufficient documentation

## 2023-12-14 DIAGNOSIS — C44310 Basal cell carcinoma of skin of unspecified parts of face: Secondary | ICD-10-CM | POA: Insufficient documentation

## 2023-12-14 DIAGNOSIS — Z85828 Personal history of other malignant neoplasm of skin: Secondary | ICD-10-CM | POA: Insufficient documentation

## 2023-12-14 DIAGNOSIS — C44319 Basal cell carcinoma of skin of other parts of face: Secondary | ICD-10-CM | POA: Insufficient documentation

## 2023-12-14 MED ORDER — AMOXICILLIN-POT CLAVULANATE 875-125 MG PO TABS: 1.0000 | ORAL_TABLET | Freq: Two times a day (BID) | ORAL | 0 refills | Status: DC

## 2023-12-14 NOTE — ED Triage Notes (Signed)
 Pt reports oral swelling x3 days. Noted a golf ball size lump on L side below ear. Unknown cause and no prior hx. Has went down some over last day.

## 2023-12-15 ENCOUNTER — Ambulatory Visit: Admitting: Adult Health

## 2023-12-15 NOTE — ED Provider Notes (Signed)
 [name]    CSN: [id number] Arrival date & time: 12/14/23  1332      History   Chief Complaint Chief Complaint  Patient presents with   Oral Swelling    HPI Pamela Robinson is a 89 y.o. female.   Patient here today for evaluation of oral swelling that started 3 days ago.  She reports that there was a golf ball sized lump below her left ear area.  She denies any significant pain to the area other than to touch.  She is unsure of the cause.  She does report that today the swelling has improved compared to yesterday.  She has not had any fever.  Denies sore throat or ear pain.  The history is provided by the patient.    Past Medical History:  Diagnosis Date   Basal cell carcinoma 12/10/2019   right malar cheek mohs   Disorder of bone density and structure, unspecified    Diverticulitis    Dysuria    Fatigue    Heart murmur    Hypercholesterolemia    Hyperlipidemia    Hypertension    Mild mood disorder (HCC)    Primary generalized (osteo)arthritis    Sleep disorder    Spinal stenosis of thoracic region    Tension headache     Patient Active Problem List   Diagnosis Date Noted   History of basal cell carcinoma (BCC) 12/14/2023   Basal cell carcinoma (BCC) of face 12/14/2023   Basal cell carcinoma (BCC) of cheek 12/14/2023   Squamous cell carcinoma of back 12/14/2023   Drug-induced constipation 10/19/2023   Therapeutic opioid induced constipation 06/21/2023   Restless legs 11/16/2021   Lumbar spondylosis 03/17/2021   Lumbar radiculopathy 12/16/2020   Spinal stenosis of lumbar region 12/16/2020   Lumbar post-laminectomy syndrome 11/06/2020   Degeneration of lumbar intervertebral disc 11/06/2020   Pain of lumbar spine 07/22/2020    Past Surgical History:  Procedure Laterality Date   ABDOMINAL HYSTERECTOMY     APPENDECTOMY     BLADDER SUSPENSION     TONSILLECTOMY      OB History   No obstetric history on file.      Home Medications     Prior to Admission medications   Medication Sig Start Date End Date Taking? Authorizing Provider  amLODipine  (NORVASC ) 5 MG tablet TAKE 1 TABLET (5 MG TOTAL) BY MOUTH DAILY. 06/28/23  Yes Wonda Sharper, MD  amoxicillin -clavulanate (AUGMENTIN ) 875-125 MG tablet Take 1 tablet by mouth every 12 (twelve) hours. 12/14/23  Yes Billy Asberry FALCON, PA-C  atenolol (TENORMIN) 100 MG tablet Take 100 mg by mouth 2 (two) times daily.   Yes [provider]  BIOTIN PO Take by mouth.   Yes [provider]  DULoxetine (CYMBALTA) 60 MG capsule Take 60 mg by mouth daily. 05/25/23  Yes [provider]  mirtazapine (REMERON) 15 MG tablet 3 TABLETS AT BEDTIME ORALLY 90 DAYS 09/22/17  Yes [provider]  naloxone (NARCAN) nasal spray 4 mg/0.1 mL Place 1 spray into the nose once. 02/02/23  Yes [provider]  Oxycodone HCl 20 MG TABS Take 1 tablet by mouth 3 (three) times daily as needed. 09/10/21  Yes [provider]  PARoxetine (PAXIL) 20 MG tablet Take 20 mg by mouth daily. 08/21/21  Yes [provider]  pregabalin  (LYRICA ) 50 MG capsule Take 1 capsule (50 mg total) by mouth at bedtime. APPOINTMENT NEEDED FOR FURTHER REFILLS 09/29/23  Yes Onita Duos, MD  simvastatin (ZOCOR) 40 MG tablet TAKE 1 TABLET BY MOUTH EVERY EVENING ONCE A DAY 10/21/17  Yes [provider]  valsartan-hydrochlorothiazide (DIOVAN-HCT) 320-12.5 MG tablet Take 1 tablet by mouth daily. 08/28/21  Yes [provider]  atenolol (TENORMIN) 50 MG tablet Take 50 mg by mouth 2 (two) times daily. Patient not taking: Reported on 12/14/2023 11/01/17   [provider]  CALCIUM GLUCONATE PO Take by mouth. Patient not taking: Reported on 12/14/2023    [provider]  ciprofloxacin  (CIPRO ) 500 MG tablet Take 500 mg by mouth 2 (two) times daily. Patient not taking: Reported on 12/14/2023 09/14/23   [provider]  polyethylene glycol (MIRALAX) 17 g packet Take 17 g by  mouth daily as needed. 06/21/23   [provider]  potassium citrate (UROCIT-K) 10 MEQ (1080 MG) SR tablet Take 10 mEq by mouth daily. Patient not taking: Reported on 12/14/2023 10/10/23   [provider]  pregabalin  (LYRICA ) 50 MG capsule Take 1 capsule (50 mg total) by mouth at bedtime. 09/28/23   Onita Duos, MD  valsartan-hydrochlorothiazide (DIOVAN-HCT) 320-25 MG tablet Take 1 tablet by mouth daily. Patient not taking: Reported on 12/14/2023    [provider]    Family History History reviewed. No pertinent family history.  Social History Social History   Tobacco Use   Smoking status: Never   Smokeless tobacco: Never  Vaping Use   Vaping status: Never Used  Substance Use Topics   Alcohol use: Yes    Alcohol/week: 7.0 standard drinks of alcohol    Types: 7 Glasses of wine per week    Comment: evening wine   Drug use: Not Currently     Allergies   Shellfish allergy, Codeine, Sulfa antibiotics, Angiotensin receptor blockers, and Shellfish-derived products   Review of Systems Review of Systems  Constitutional:  Negative for chills and fever.  HENT:  Negative for congestion, ear pain and sore throat.   Eyes:  Negative for discharge and redness.  Respiratory:  Negative for shortness of breath.   Gastrointestinal:  Negative for abdominal pain, nausea and vomiting.     Physical Exam Triage Vital Signs ED Triage Vitals [12/14/23 1441]  Encounter Vitals Group     BP 127/72     Girls Systolic BP Percentile      Girls Diastolic BP Percentile      Boys Systolic BP Percentile      Boys Diastolic BP Percentile      Pulse Rate 62     Resp 18     Temp 97.9 F (36.6 C)     Temp Source Oral     SpO2 94 %     Weight      Height      Head Circumference      Peak Flow      Pain Score 0     Pain Loc      Pain Education      Exclude from Growth Chart    No data found.  Updated Vital Signs BP 127/72 (BP Location: Left Arm)   Pulse 62   Temp 97.9  F (36.6 C) (Oral)   Resp 18   SpO2 94%   Visual Acuity Right Eye Distance:   Left Eye Distance:   Bilateral Distance:    Right Eye Near:   Left Eye Near:    Bilateral Near:     Physical Exam Vitals and nursing note reviewed.  Constitutional:      General: She  is not in acute distress.    Appearance: Normal appearance. She is not ill-appearing.  HENT:     Head: Normocephalic and atraumatic.     Right Ear: Tympanic membrane normal.     Left Ear: Tympanic membrane normal.     Nose: Nose normal. No congestion or rhinorrhea.     Mouth/Throat:     Mouth: Mucous membranes are moist.     Pharynx: Oropharynx is clear. No oropharyngeal exudate or posterior oropharyngeal erythema.     Comments: Parotid swelling to left- firm induration Eyes:     Conjunctiva/sclera: Conjunctivae normal.  Cardiovascular:     Rate and Rhythm: Normal rate.  Pulmonary:     Effort: Pulmonary effort is normal. No respiratory distress.  Neurological:     Mental Status: She is alert.  Psychiatric:        Mood and Affect: Mood normal.        Behavior: Behavior normal.        Thought Content: Thought content normal.      UC Treatments / Results  Labs (all labs ordered are listed, but only abnormal results are displayed) Labs Reviewed - No data to display  EKG   Radiology No results found.  Procedures Procedures (including critical care time)  Medications Ordered in UC Medications - No data to display  Initial Impression / Assessment and Plan / UC Course  I have reviewed the triage vital signs and the nursing notes.  Pertinent labs & imaging results that were available during my care of the patient were reviewed by me and considered in my medical decision making (see chart for details).   Suspect parotiditis.  Recommended antibiotics to cover possible infective cause but also sour candy to hopefully help release any stones that could be causing blockages and promote salivation.  Patient  expressed understanding.  Advised emergency room evaluation with any worsening symptoms.  Encouraged her to follow-up with her primary care doctor symptoms persist.  Patient expressed understanding.  Final Clinical Impressions(s) / UC Diagnoses   Final diagnoses:  Parotiditis   Discharge Instructions   None    ED Prescriptions     Medication Sig Dispense Auth. Provider   amoxicillin -clavulanate (AUGMENTIN ) 875-125 MG tablet Take 1 tablet by mouth every 12 (twelve) hours. 14 tablet Billy Asberry FALCON, PA-C      PDMP not reviewed this encounter.   Billy Asberry FALCON, PA-C 12/15/23 1005

## 2023-12-16 DIAGNOSIS — N1330 Unspecified hydronephrosis: Secondary | ICD-10-CM | POA: Diagnosis not present

## 2023-12-16 DIAGNOSIS — N1832 Chronic kidney disease, stage 3b: Secondary | ICD-10-CM | POA: Diagnosis not present

## 2023-12-16 DIAGNOSIS — N259 Disorder resulting from impaired renal tubular function, unspecified: Secondary | ICD-10-CM | POA: Diagnosis not present

## 2023-12-26 DIAGNOSIS — N1832 Chronic kidney disease, stage 3b: Secondary | ICD-10-CM | POA: Diagnosis not present

## 2023-12-26 DIAGNOSIS — K118 Other diseases of salivary glands: Secondary | ICD-10-CM | POA: Diagnosis not present

## 2023-12-26 DIAGNOSIS — I10 Essential (primary) hypertension: Secondary | ICD-10-CM | POA: Diagnosis not present

## 2023-12-26 DIAGNOSIS — Z0000 Encounter for general adult medical examination without abnormal findings: Secondary | ICD-10-CM | POA: Diagnosis not present

## 2023-12-26 DIAGNOSIS — I350 Nonrheumatic aortic (valve) stenosis: Secondary | ICD-10-CM | POA: Diagnosis not present

## 2023-12-26 DIAGNOSIS — E7800 Pure hypercholesterolemia, unspecified: Secondary | ICD-10-CM | POA: Diagnosis not present

## 2023-12-26 DIAGNOSIS — R7303 Prediabetes: Secondary | ICD-10-CM | POA: Diagnosis not present

## 2023-12-26 DIAGNOSIS — C44320 Squamous cell carcinoma of skin of unspecified parts of face: Secondary | ICD-10-CM | POA: Diagnosis not present

## 2023-12-26 DIAGNOSIS — Z1159 Encounter for screening for other viral diseases: Secondary | ICD-10-CM | POA: Diagnosis not present

## 2024-02-08 DIAGNOSIS — T798XXA Other early complications of trauma, initial encounter: Secondary | ICD-10-CM | POA: Diagnosis not present

## 2024-02-08 DIAGNOSIS — I350 Nonrheumatic aortic (valve) stenosis: Secondary | ICD-10-CM | POA: Diagnosis not present

## 2024-02-08 DIAGNOSIS — R600 Localized edema: Secondary | ICD-10-CM | POA: Diagnosis not present

## 2024-02-08 DIAGNOSIS — I10 Essential (primary) hypertension: Secondary | ICD-10-CM | POA: Diagnosis not present

## 2024-02-08 DIAGNOSIS — N1832 Chronic kidney disease, stage 3b: Secondary | ICD-10-CM | POA: Diagnosis not present

## 2024-02-16 DIAGNOSIS — M5416 Radiculopathy, lumbar region: Secondary | ICD-10-CM | POA: Diagnosis not present

## 2024-02-16 DIAGNOSIS — Z5181 Encounter for therapeutic drug level monitoring: Secondary | ICD-10-CM | POA: Diagnosis not present

## 2024-02-16 DIAGNOSIS — M48062 Spinal stenosis, lumbar region with neurogenic claudication: Secondary | ICD-10-CM | POA: Diagnosis not present

## 2024-02-21 DIAGNOSIS — E785 Hyperlipidemia, unspecified: Secondary | ICD-10-CM | POA: Diagnosis not present

## 2024-02-21 DIAGNOSIS — R7303 Prediabetes: Secondary | ICD-10-CM | POA: Diagnosis not present

## 2024-02-21 DIAGNOSIS — I129 Hypertensive chronic kidney disease with stage 1 through stage 4 chronic kidney disease, or unspecified chronic kidney disease: Secondary | ICD-10-CM | POA: Diagnosis not present

## 2024-02-21 DIAGNOSIS — N1832 Chronic kidney disease, stage 3b: Secondary | ICD-10-CM | POA: Diagnosis not present

## 2024-04-16 ENCOUNTER — Emergency Department (HOSPITAL_BASED_OUTPATIENT_CLINIC_OR_DEPARTMENT_OTHER)

## 2024-04-16 ENCOUNTER — Inpatient Hospital Stay (HOSPITAL_BASED_OUTPATIENT_CLINIC_OR_DEPARTMENT_OTHER)
Admission: EM | Admit: 2024-04-16 | Discharge: 2024-04-21 | DRG: 291 | Disposition: A | Discharge status: Home / Self Care | Attending: Internal Medicine | Admitting: Internal Medicine

## 2024-04-16 ENCOUNTER — Emergency Department (HOSPITAL_BASED_OUTPATIENT_CLINIC_OR_DEPARTMENT_OTHER): Admitting: Radiology

## 2024-04-16 ENCOUNTER — Encounter (HOSPITAL_BASED_OUTPATIENT_CLINIC_OR_DEPARTMENT_OTHER): Payer: Self-pay

## 2024-04-16 ENCOUNTER — Other Ambulatory Visit: Payer: Self-pay

## 2024-04-16 DIAGNOSIS — Z888 Allergy status to other drugs, medicaments and biological substances status: Secondary | ICD-10-CM

## 2024-04-16 DIAGNOSIS — J9811 Atelectasis: Secondary | ICD-10-CM | POA: Diagnosis present

## 2024-04-16 DIAGNOSIS — M150 Primary generalized (osteo)arthritis: Secondary | ICD-10-CM | POA: Diagnosis present

## 2024-04-16 DIAGNOSIS — E876 Hypokalemia: Secondary | ICD-10-CM | POA: Diagnosis present

## 2024-04-16 DIAGNOSIS — F32A Depression, unspecified: Secondary | ICD-10-CM | POA: Diagnosis present

## 2024-04-16 DIAGNOSIS — Z885 Allergy status to narcotic agent status: Secondary | ICD-10-CM

## 2024-04-16 DIAGNOSIS — Z7901 Long term (current) use of anticoagulants: Secondary | ICD-10-CM

## 2024-04-16 DIAGNOSIS — I5082 Biventricular heart failure: Secondary | ICD-10-CM | POA: Diagnosis present

## 2024-04-16 DIAGNOSIS — J90 Pleural effusion, not elsewhere classified: Secondary | ICD-10-CM

## 2024-04-16 DIAGNOSIS — Z79899 Other long term (current) drug therapy: Secondary | ICD-10-CM

## 2024-04-16 DIAGNOSIS — I4819 Other persistent atrial fibrillation: Secondary | ICD-10-CM | POA: Diagnosis present

## 2024-04-16 DIAGNOSIS — Z85828 Personal history of other malignant neoplasm of skin: Secondary | ICD-10-CM

## 2024-04-16 DIAGNOSIS — I160 Hypertensive urgency: Secondary | ICD-10-CM | POA: Diagnosis present

## 2024-04-16 DIAGNOSIS — I5033 Acute on chronic diastolic (congestive) heart failure: Secondary | ICD-10-CM

## 2024-04-16 DIAGNOSIS — D72829 Elevated white blood cell count, unspecified: Secondary | ICD-10-CM | POA: Diagnosis present

## 2024-04-16 DIAGNOSIS — I5031 Acute diastolic (congestive) heart failure: Secondary | ICD-10-CM | POA: Diagnosis present

## 2024-04-16 DIAGNOSIS — Z91013 Allergy to seafood: Secondary | ICD-10-CM

## 2024-04-16 DIAGNOSIS — I082 Rheumatic disorders of both aortic and tricuspid valves: Secondary | ICD-10-CM | POA: Diagnosis present

## 2024-04-16 DIAGNOSIS — I509 Heart failure, unspecified: Principal | ICD-10-CM

## 2024-04-16 DIAGNOSIS — Z882 Allergy status to sulfonamides status: Secondary | ICD-10-CM

## 2024-04-16 DIAGNOSIS — I2510 Atherosclerotic heart disease of native coronary artery without angina pectoris: Secondary | ICD-10-CM | POA: Diagnosis present

## 2024-04-16 DIAGNOSIS — I350 Nonrheumatic aortic (valve) stenosis: Secondary | ICD-10-CM | POA: Diagnosis present

## 2024-04-16 DIAGNOSIS — N1831 Chronic kidney disease, stage 3a: Secondary | ICD-10-CM | POA: Diagnosis present

## 2024-04-16 DIAGNOSIS — I10 Essential (primary) hypertension: Secondary | ICD-10-CM

## 2024-04-16 DIAGNOSIS — E7800 Pure hypercholesterolemia, unspecified: Secondary | ICD-10-CM | POA: Diagnosis present

## 2024-04-16 DIAGNOSIS — N179 Acute kidney failure, unspecified: Secondary | ICD-10-CM | POA: Diagnosis not present

## 2024-04-16 DIAGNOSIS — Z90710 Acquired absence of both cervix and uterus: Secondary | ICD-10-CM

## 2024-04-16 DIAGNOSIS — I130 Hypertensive heart and chronic kidney disease with heart failure and stage 1 through stage 4 chronic kidney disease, or unspecified chronic kidney disease: Principal | ICD-10-CM | POA: Diagnosis present

## 2024-04-16 DIAGNOSIS — I4891 Unspecified atrial fibrillation: Secondary | ICD-10-CM | POA: Insufficient documentation

## 2024-04-16 LAB — CBC
HCT: 37.9 % (ref 36.0–46.0)
Hemoglobin: 12.9 g/dL (ref 12.0–15.0)
MCH: 29.5 pg (ref 26.0–34.0)
MCHC: 34 g/dL (ref 30.0–36.0)
MCV: 86.7 fL (ref 80.0–100.0)
Platelets: 237 K/uL (ref 150–400)
RBC: 4.37 MIL/uL (ref 3.87–5.11)
RDW: 14.5 % (ref 11.5–15.5)
WBC: 9.1 K/uL (ref 4.0–10.5)
nRBC: 0 % (ref 0.0–0.2)

## 2024-04-16 LAB — BASIC METABOLIC PANEL WITH GFR
Anion gap: 12 (ref 5–15)
BUN: 12 mg/dL (ref 8–23)
CO2: 29 mmol/L (ref 22–32)
Calcium: 9.3 mg/dL (ref 8.9–10.3)
Chloride: 98 mmol/L (ref 98–111)
Creatinine, Ser: 1.04 mg/dL — ABNORMAL HIGH (ref 0.44–1.00)
GFR, Estimated: 51 mL/min — ABNORMAL LOW (ref >60)
Glucose, Bld: 112 mg/dL — ABNORMAL HIGH (ref 70–99)
Potassium: 3 mmol/L — ABNORMAL LOW (ref 3.5–5.1)
Sodium: 139 mmol/L (ref 135–145)

## 2024-04-16 LAB — D-DIMER, QUANTITATIVE: D-Dimer, Quant: 3.33 ug{FEU}/mL — ABNORMAL HIGH (ref 0.00–0.50)

## 2024-04-16 LAB — PRO BRAIN NATRIURETIC PEPTIDE: Pro Brain Natriuretic Peptide: 11800 pg/mL — ABNORMAL HIGH (ref <300.0)

## 2024-04-16 LAB — MAGNESIUM: Magnesium: 2 mg/dL (ref 1.7–2.4)

## 2024-04-16 LAB — TROPONIN T, HIGH SENSITIVITY
Troponin T High Sensitivity: <15 ng/L (ref 0–19)
Troponin T High Sensitivity: <15 ng/L (ref 0–19)

## 2024-04-16 MED ORDER — IOHEXOL 350 MG/ML SOLN
75.0000 mL | Freq: Once | INTRAVENOUS | Status: AC | PRN
  Administered 2024-04-16: 75 mL via INTRAVENOUS

## 2024-04-16 MED ORDER — HEPARIN (PORCINE) 25000 UT/250ML-% IV SOLN
900.0000 [IU]/h | INTRAVENOUS | Status: DC
  Administered 2024-04-16 – 2024-04-19 (×3): 900 [IU]/h via INTRAVENOUS
  Filled 2024-04-16 (×3): qty 250

## 2024-04-16 MED ORDER — HEPARIN BOLUS VIA INFUSION
3000.0000 [IU] | Freq: Once | INTRAVENOUS | Status: AC
  Administered 2024-04-16: 3000 [IU] via INTRAVENOUS

## 2024-04-16 MED ORDER — FUROSEMIDE 10 MG/ML IJ SOLN
40.0000 mg | Freq: Once | INTRAMUSCULAR | Status: AC
  Administered 2024-04-16: 40 mg via INTRAVENOUS
  Filled 2024-04-16: qty 4

## 2024-04-16 MED ORDER — POTASSIUM CHLORIDE CRYS ER 20 MEQ PO TBCR
40.0000 meq | EXTENDED_RELEASE_TABLET | Freq: Once | ORAL | Status: AC
  Administered 2024-04-16: 40 meq via ORAL
  Filled 2024-04-16: qty 2

## 2024-04-16 MED ORDER — NITROGLYCERIN 2 % TD OINT
1.0000 [in_us] | TOPICAL_OINTMENT | Freq: Four times a day (QID) | TRANSDERMAL | Status: DC
Start: 2024-04-17 — End: 2024-04-17
  Administered 2024-04-16: 1 [in_us] via TOPICAL
  Filled 2024-04-16: qty 1

## 2024-04-16 MED ORDER — POTASSIUM CHLORIDE CRYS ER 20 MEQ PO TBCR
20.0000 meq | EXTENDED_RELEASE_TABLET | Freq: Once | ORAL | Status: AC
  Administered 2024-04-16: 20 meq via ORAL
  Filled 2024-04-16: qty 1

## 2024-04-16 NOTE — ED Provider Notes (Signed)
 [place] EMERGENCY DEPARTMENT AT [facility] Provider Note   CSN: [id number] Arrival date & time: 04/16/24  1754     Patient presents with: Atrial Fibrillation   Pamela Robinson is a 89 y.o. female.    Atrial Fibrillation     Patient has a history of diverticulosis hypertension hypercholesterolemia who presents to the ED with complaints of shortness of breath and high blood pressure.  Patient noted that over the weekend her blood pressure has been running high over 190s.  She also started to notice that she was feeling short of breath.  She also has felt more fatigued.  She went to see her primary care doctor today who noticed that she was in atrial fibrillation.  She was instructed to come to the emergency room to get checked out  Prior to Admission medications   Medication Sig Start Date End Date Taking? Authorizing Provider  amLODipine  (NORVASC ) 5 MG tablet TAKE 1 TABLET (5 MG TOTAL) BY MOUTH DAILY. 06/28/23   Wonda Sharper, MD  amoxicillin -clavulanate (AUGMENTIN ) 875-125 MG tablet Take 1 tablet by mouth every 12 (twelve) hours. 12/14/23   Billy Asberry FALCON, PA-C  atenolol  (TENORMIN ) 100 MG tablet Take 100 mg by mouth 2 (two) times daily.    [provider]  atenolol  (TENORMIN ) 50 MG tablet Take 50 mg by mouth 2 (two) times daily. Patient not taking: Reported on 12/14/2023 11/01/17   [provider]  BIOTIN PO Take by mouth.    [provider]  CALCIUM GLUCONATE PO Take by mouth. Patient not taking: Reported on 12/14/2023    [provider]  ciprofloxacin  (CIPRO ) 500 MG tablet Take 500 mg by mouth 2 (two) times daily. Patient not taking: Reported on 12/14/2023 09/14/23   [provider]  DULoxetine  (CYMBALTA ) 60 MG capsule Take 60 mg by mouth daily. 05/25/23   [provider]  mirtazapine  (REMERON ) 15 MG tablet 3 TABLETS AT BEDTIME ORALLY 90 DAYS 09/22/17   [provider]  naloxone (NARCAN) nasal spray 4 mg/0.1  mL Place 1 spray into the nose once. 02/02/23   [provider]  Oxycodone  HCl 20 MG TABS Take 1 tablet by mouth 3 (three) times daily as needed. 09/10/21   [provider]  PARoxetine (PAXIL) 20 MG tablet Take 20 mg by mouth daily. 08/21/21   [provider]  polyethylene glycol (MIRALAX) 17 g packet Take 17 g by mouth daily as needed. 06/21/23   [provider]  potassium citrate (UROCIT-K) 10 MEQ (1080 MG) SR tablet Take 10 mEq by mouth daily. Patient not taking: Reported on 12/14/2023 10/10/23   [provider]  pregabalin  (LYRICA ) 50 MG capsule Take 1 capsule (50 mg total) by mouth at bedtime. APPOINTMENT NEEDED FOR FURTHER REFILLS 09/29/23   Onita Duos, MD  pregabalin  (LYRICA ) 50 MG capsule Take 1 capsule (50 mg total) by mouth at bedtime. 09/28/23   Onita Duos, MD  simvastatin  (ZOCOR ) 40 MG tablet TAKE 1 TABLET BY MOUTH EVERY EVENING ONCE A DAY 10/21/17   [provider]  valsartan-hydrochlorothiazide (DIOVAN-HCT) 320-12.5 MG tablet Take 1 tablet by mouth daily. 08/28/21   [provider]  valsartan-hydrochlorothiazide (DIOVAN-HCT) 320-25 MG tablet Take 1 tablet by mouth daily. Patient not taking: Reported on 12/14/2023    [provider]    Allergies: Shellfish allergy, Codeine, Sulfa antibiotics, Angiotensin receptor blockers, and Shellfish protein-containing drug products    Review of Systems  Updated Vital Signs BP (!) 197/93   Pulse 61  Temp 98.4 F (36.9 C) (Oral)   Resp 17   SpO2 98%   Physical Exam Vitals and nursing note reviewed.  Constitutional:      General: She is not in acute distress.    Appearance: She is well-developed.  HENT:     Head: Normocephalic and atraumatic.     Right Ear: External ear normal.     Left Ear: External ear normal.  Eyes:     General: No scleral icterus.       Right eye: No discharge.        Left eye: No discharge.     Conjunctiva/sclera: Conjunctivae normal.  Neck:      Trachea: No tracheal deviation.  Cardiovascular:     Rate and Rhythm: Normal rate. Rhythm irregular.  Pulmonary:     Effort: Pulmonary effort is normal. No respiratory distress.     Breath sounds: Normal breath sounds. No stridor. No wheezing or rales.  Abdominal:     General: Bowel sounds are normal. There is no distension.     Palpations: Abdomen is soft.     Tenderness: There is no abdominal tenderness. There is no guarding or rebound.  Musculoskeletal:        General: No tenderness or deformity.     Cervical back: Neck supple.  Skin:    General: Skin is warm and dry.     Findings: No rash.  Neurological:     General: No focal deficit present.     Mental Status: She is alert.     Cranial Nerves: No cranial nerve deficit, dysarthria or facial asymmetry.     Sensory: No sensory deficit.     Motor: No abnormal muscle tone or seizure activity.     Coordination: Coordination normal.  Psychiatric:        Mood and Affect: Mood normal.     (all labs ordered are listed, but only abnormal results are displayed) Labs Reviewed  BASIC METABOLIC PANEL WITH GFR - Abnormal; Notable for the following components:      Result Value   Potassium 3.0 (*)    Glucose, Bld 112 (*)    Creatinine, Ser 1.04 (*)    GFR, Estimated 51 (*)    All other components within normal limits  PRO BRAIN NATRIURETIC PEPTIDE - Abnormal; Notable for the following components:   Pro Brain Natriuretic Peptide 11,800.0 (*)    All other components within normal limits  D-DIMER, QUANTITATIVE (NOT AT [facility]) - Abnormal; Notable for the following components:   D-Dimer, Quant 3.33 (*)    All other components within normal limits  CBC  MAGNESIUM   TROPONIN T, HIGH SENSITIVITY  TROPONIN T, HIGH SENSITIVITY    EKG: EKG Interpretation Date/Time:  Monday April 16 2024 18:01:24 EST Ventricular Rate:  64 PR Interval:    QRS Duration:  130 QT Interval:  446 QTC Calculation: 460 R Axis:   -56  Text  Interpretation: Atrial fibrillation Left axis deviation Non-specific intra-ventricular conduction block Cannot rule out Septal infarct (cited on or before 06-Jun-2023) Possible Lateral infarct (cited on or before 06-Jun-2023) Abnormal ECG When compared with ECG of 06-Jun-2023 15:54, a fibs is new since last tracing Confirmed by Randol Simmonds [phone]) on 04/16/2024 6:04:00 PM  Radiology: CT Angio Chest PE W and/or Wo Contrast Result Date: 04/16/2024 EXAM: CTA of the Chest with contrast for PE 04/16/2024 08:38:17 PM TECHNIQUE: CTA of the chest was performed after the administration of intravenous contrast. Multiplanar reformatted images are provided for review.  MIP images are provided for review. Automated exposure control, iterative reconstruction, and/or weight based adjustment of the mA/kV was utilized to reduce the radiation dose to as low as reasonably achievable. COMPARISON: Chest radiograph 04/16/2024 CLINICAL HISTORY: Pulmonary embolism (PE) suspected, low to intermediate prob, positive D-dimer. FINDINGS: LIMITATIONS/ARTIFACTS: Moderate motion artifact limits examination. PULMONARY ARTERIES: Pulmonary arteries are adequately opacified for evaluation. No pulmonary embolism. Main pulmonary artery is normal in caliber. MEDIASTINUM: Cardiac enlargement with particular right heart enlargement. Reflux of contrast material into the hepatic vein suggests right heart failure. No pericardial effusions. Calcification throughout the aorta and coronary arteries. LYMPH NODES: No mediastinal, hilar or axillary lymphadenopathy. LUNGS AND PLEURA: Large bilateral pleural effusions with basilar atelectasis. No pneumothorax. UPPER ABDOMEN: Limited images of the upper abdomen demonstrate reflux of contrast material into the hepatic vein, suggesting right heart failure. SOFT TISSUES AND BONES: Degenerative changes in the spine. No acute bone or soft tissue abnormality. IMPRESSION: 1. No pulmonary embolism. 2. Large bilateral  pleural effusions with basilar atelectasis. 3. Cardiomegaly with right heart enlargement and reflux of contrast into the hepatic veins, suggestive of right heart failure. Electronically signed by: Elsie Gravely MD 04/16/2024 08:44 PM EST RP Workstation: [id number]   DG Chest 2 View Result Date: 04/16/2024 CLINICAL DATA:  Chest pain, hypertension EXAM: CHEST - 2 VIEW COMPARISON:  09/25/2019 FINDINGS: Frontal and lateral views of the chest demonstrate an unremarkable cardiac silhouette. There is bibasilar consolidation with small bilateral pleural effusions, right greater than left. No pneumothorax. No acute bony abnormalities. Stable compression deformities at the thoracolumbar junction. IMPRESSION: 1. Bibasilar consolidation, right greater than left, which may reflect airspace disease or atelectasis. 2. Small bilateral pleural effusions, right greater than left. Electronically Signed   By: Ozell Daring M.D.   On: 04/16/2024 19:28     Procedures   Medications Ordered in the ED  nitroGLYCERIN  (NITROGLYN) 2 % ointment 1 inch (has no administration in time range)  potassium chloride  SA (KLOR-CON  M) CR tablet 40 mEq (40 mEq Oral Given 04/16/24 1915)  iohexol  (OMNIPAQUE ) 350 MG/ML injection 75 mL (75 mLs Intravenous Contrast Given 04/16/24 2031)  furosemide  (LASIX ) injection 40 mg (40 mg Intravenous Given 04/16/24 2212)  potassium chloride  SA (KLOR-CON  M) CR tablet 20 mEq (20 mEq Oral Given 04/16/24 2212)    Clinical Course as of 04/16/24 2312  Mon Apr 16, 2024  1842 CBC normal.  Metabolic panel shows hypokalemia.  Troponin normal. [JK]  2020 D-dimer, quantitative(!) D-dimer elevated at 3.33.  proBNP elevated at 11,800 [JK]  2021 Troponin T, High Sensitivity Opponent normal.  CBC normal.  Metabolic panel normal. [JK]  2204 CT angiogram does not show evidence of pulmonary embolism.  Patient does have evidence of large bilateral pleural effusions and cardiomegaly [JK]  2311 Case discussed with  Dr Lenward regarding admission [JK]    Clinical Course User Index [JK] Randol Simmonds, MD                                 Medical Decision Making Problems Addressed: Acute congestive heart failure, unspecified heart failure type [facility]): acute illness or injury that poses a threat to life or bodily functions Atrial fibrillation, unspecified type [facility]): undiagnosed new problem with uncertain prognosis  Amount and/or Complexity of Data Reviewed Labs: ordered. Decision-making details documented in ED Course. Radiology: ordered and independent interpretation performed.  Risk Prescription drug management.   Patient presented to the ED with complaints of  increasing shortness of breath.  Patient also noted to have worsening hypertension.  Patient Santina to her doctor's office today and there was noted to have new onset atrial fibrillation  Patient's ED workup does show atrial fibrillation.  Her laboratory tests are notable for hypokalemia, elevated D-dimer and elevated BNP.  Patient was treated with potassium replacement.  CT angiogram was performed considering her elevated D-dimer and there is no evidence of pulmonary embolism.  Does show findings consistent with congestive heart failure.  Troponins are normal arguing against acute cardiac injury.  I have ordered IV Lasix  and nitroglycerin  in addition to her potassium replacement.  Patient is currently at a freestanding ED.  I will consult with the hospitalist service for admission and further treatment    Final diagnoses:  Acute congestive heart failure, unspecified heart failure type [facility])  Atrial fibrillation, unspecified type [facility])    ED Discharge Orders          Ordered    Amb referral to AFIB Clinic        04/16/24 1842               Randol Simmonds, MD 04/16/24 2222

## 2024-04-16 NOTE — Progress Notes (Signed)
 ANTICOAGULATION CONSULT NOTE  Pharmacy Consult for Heparin  Indication: atrial fibrillation  Allergies  Allergen Reactions   Shellfish Allergy Swelling    Other Reaction(s): Not available   Codeine Nausea And Vomiting    Other Reaction(s): Not available, Unknown  Other Reaction(s): Not available, Not available, Not available   Sulfa Antibiotics Nausea And Vomiting    Other Reaction(s): Not available  Other Reaction(s): Unknown   Angiotensin Receptor Blockers Cough   Shellfish Protein-Containing Drug Products     Other Reaction(s): Unknown    Patient Measurements:   Heparin  Dosing Weight: 62 kg  Vital Signs: Temp: 98.4 F (36.9 C) (11/24 2216) Temp Source: Oral (11/24 2216) BP: 197/93 (11/24 2024) Pulse Rate: 61 (11/24 2024)  Labs: Recent Labs    04/16/24 1812  HGB 12.9  HCT 37.9  PLT 237  CREATININE 1.04*    CrCl cannot be calculated (Unknown ideal weight.).   Medical History: Past Medical History:  Diagnosis Date   Acute CHF (congestive heart failure) (HCC) 04/16/2024   Basal cell carcinoma 12/10/2019   right malar cheek mohs   Disorder of bone density and structure, unspecified    Diverticulitis    Dysuria    Fatigue    Heart murmur    Hypercholesterolemia    Hyperlipidemia    Hypertension    Mild mood disorder    Primary generalized (osteo)arthritis    Sleep disorder    Spinal stenosis of thoracic region    Tension headache     Medications:  (Not in a hospital admission)  Scheduled:   [START ON 04/17/2024] nitroGLYCERIN   1 inch Topical Q6H   Infusions:  PRN:   Assessment: 42 yof with a history of HTN. Patient is presenting with SOB. Noted to have new onset AF at PCP office today. Heparin  per pharmacy consult placed for atrial fibrillation.  No evidence of PE on CTA.  Patient is not on anticoagulation prior to arrival.  Hgb 12.9; plt 237  Goal of Therapy:  Heparin  level 0.3-0.7 units/ml Monitor platelets by anticoagulation  protocol: Yes   Plan:  Give IV heparin  3000 units bolus x 1 Start heparin  infusion at 900 units/hr Check anti-Xa level in 8 hours and daily while on heparin  Continue to monitor H&H and platelets  Dorn Buttner, PharmD, BCPS 04/16/2024 11:39 PM ED Clinical Pharmacist -  [phone]

## 2024-04-16 NOTE — ED Triage Notes (Signed)
 Pt c/o HTN (over 190 over the weekend, 170 was as low as it got- that's what the doctor got today), found to be in afib at PCP, sent for eval Symptoms all weekend- SHOB, fatigue, dizzy/ lightheaded

## 2024-04-17 ENCOUNTER — Inpatient Hospital Stay (HOSPITAL_COMMUNITY)

## 2024-04-17 DIAGNOSIS — I50811 Acute right heart failure: Secondary | ICD-10-CM

## 2024-04-17 DIAGNOSIS — I4891 Unspecified atrial fibrillation: Secondary | ICD-10-CM

## 2024-04-17 DIAGNOSIS — I350 Nonrheumatic aortic (valve) stenosis: Secondary | ICD-10-CM | POA: Diagnosis not present

## 2024-04-17 DIAGNOSIS — I160 Hypertensive urgency: Secondary | ICD-10-CM

## 2024-04-17 DIAGNOSIS — I509 Heart failure, unspecified: Secondary | ICD-10-CM | POA: Diagnosis not present

## 2024-04-17 LAB — BASIC METABOLIC PANEL WITH GFR
Anion gap: 12 (ref 5–15)
BUN: 9 mg/dL (ref 8–23)
CO2: 26 mmol/L (ref 22–32)
Calcium: 9.1 mg/dL (ref 8.9–10.3)
Chloride: 99 mmol/L (ref 98–111)
Creatinine, Ser: 1.03 mg/dL — ABNORMAL HIGH (ref 0.44–1.00)
GFR, Estimated: 52 mL/min — ABNORMAL LOW (ref >60)
Glucose, Bld: 87 mg/dL (ref 70–99)
Potassium: 3.4 mmol/L — ABNORMAL LOW (ref 3.5–5.1)
Sodium: 137 mmol/L (ref 135–145)

## 2024-04-17 LAB — HEPATIC FUNCTION PANEL
ALT: 10 U/L (ref 0–44)
AST: 22 U/L (ref 15–41)
Albumin: 3.6 g/dL (ref 3.5–5.0)
Alkaline Phosphatase: 83 U/L (ref 38–126)
Bilirubin, Direct: 0.3 mg/dL — ABNORMAL HIGH (ref 0.0–0.2)
Indirect Bilirubin: 0.9 mg/dL (ref 0.3–0.9)
Total Bilirubin: 1.2 mg/dL (ref 0.0–1.2)
Total Protein: 6.9 g/dL (ref 6.5–8.1)

## 2024-04-17 LAB — CBC
HCT: 39.5 % (ref 36.0–46.0)
Hemoglobin: 13.8 g/dL (ref 12.0–15.0)
MCH: 29.6 pg (ref 26.0–34.0)
MCHC: 34.9 g/dL (ref 30.0–36.0)
MCV: 84.8 fL (ref 80.0–100.0)
Platelets: 251 K/uL (ref 150–400)
RBC: 4.66 MIL/uL (ref 3.87–5.11)
RDW: 14.6 % (ref 11.5–15.5)
WBC: 11.4 K/uL — ABNORMAL HIGH (ref 4.0–10.5)
nRBC: 0 % (ref 0.0–0.2)

## 2024-04-17 LAB — TSH: TSH: 5.117 u[IU]/mL — ABNORMAL HIGH (ref 0.350–4.500)

## 2024-04-17 LAB — HEPARIN LEVEL (UNFRACTIONATED)
Heparin Unfractionated: 0.54 [IU]/mL (ref 0.30–0.70)
Heparin Unfractionated: 0.59 [IU]/mL (ref 0.30–0.70)

## 2024-04-17 MED ORDER — DIPHENHYDRAMINE HCL 50 MG/ML IJ SOLN
25.0000 mg | Freq: Once | INTRAMUSCULAR | Status: AC
  Administered 2024-04-17: 25 mg via INTRAVENOUS
  Filled 2024-04-17: qty 1

## 2024-04-17 MED ORDER — HYDRALAZINE HCL 20 MG/ML IJ SOLN
10.0000 mg | Freq: Once | INTRAMUSCULAR | Status: AC
  Administered 2024-04-17: 10 mg via INTRAVENOUS
  Filled 2024-04-17: qty 1

## 2024-04-17 MED ORDER — DIPHENHYDRAMINE HCL 50 MG/ML IJ SOLN
INTRAMUSCULAR | Status: AC
  Filled 2024-04-17: qty 1

## 2024-04-17 MED ORDER — SODIUM CHLORIDE 0.9 % IV SOLN
250.0000 mL | INTRAVENOUS | Status: AC | PRN
Start: 2024-04-17 — End: 2024-04-18

## 2024-04-17 MED ORDER — ACETAMINOPHEN 325 MG PO TABS
650.0000 mg | ORAL_TABLET | ORAL | Status: DC | PRN
  Administered 2024-04-18: 650 mg via ORAL
  Filled 2024-04-17 (×2): qty 2

## 2024-04-17 MED ORDER — ACETAMINOPHEN 500 MG PO TABS
1000.0000 mg | ORAL_TABLET | Freq: Once | ORAL | Status: AC
  Administered 2024-04-17: 1000 mg via ORAL
  Filled 2024-04-17: qty 2

## 2024-04-17 MED ORDER — OXYCODONE HCL 5 MG PO TABS
20.0000 mg | ORAL_TABLET | Freq: Three times a day (TID) | ORAL | Status: DC | PRN
  Administered 2024-04-17 – 2024-04-21 (×6): 20 mg via ORAL
  Filled 2024-04-17 (×6): qty 4

## 2024-04-17 MED ORDER — SODIUM CHLORIDE 0.9% FLUSH
3.0000 mL | Freq: Two times a day (BID) | INTRAVENOUS | Status: DC
  Administered 2024-04-17 – 2024-04-21 (×9): 3 mL via INTRAVENOUS

## 2024-04-17 MED ORDER — FUROSEMIDE 10 MG/ML IJ SOLN
40.0000 mg | Freq: Two times a day (BID) | INTRAMUSCULAR | Status: DC
  Administered 2024-04-17 – 2024-04-18 (×2): 40 mg via INTRAVENOUS
  Filled 2024-04-17 (×2): qty 4

## 2024-04-17 MED ORDER — ATENOLOL 25 MG PO TABS
100.0000 mg | ORAL_TABLET | Freq: Two times a day (BID) | ORAL | Status: DC
  Administered 2024-04-17: 100 mg via ORAL
  Filled 2024-04-17: qty 4

## 2024-04-17 MED ORDER — PREGABALIN 25 MG PO CAPS
50.0000 mg | ORAL_CAPSULE | Freq: Every day | ORAL | Status: DC
  Administered 2024-04-17 – 2024-04-20 (×4): 50 mg via ORAL
  Filled 2024-04-17 (×4): qty 2

## 2024-04-17 MED ORDER — LABETALOL HCL 5 MG/ML IV SOLN
10.0000 mg | Freq: Once | INTRAVENOUS | Status: AC
  Administered 2024-04-17: 10 mg via INTRAVENOUS
  Filled 2024-04-17: qty 4

## 2024-04-17 MED ORDER — SODIUM CHLORIDE 0.9% FLUSH: 3.0000 mL | INTRAVENOUS | Status: DC | PRN

## 2024-04-17 MED ORDER — MIRTAZAPINE 15 MG PO TABS
15.0000 mg | ORAL_TABLET | Freq: Every day | ORAL | Status: DC
  Administered 2024-04-17 – 2024-04-20 (×4): 15 mg via ORAL
  Filled 2024-04-17 (×4): qty 1

## 2024-04-17 MED ORDER — ONDANSETRON HCL 4 MG/2ML IJ SOLN
4.0000 mg | Freq: Four times a day (QID) | INTRAMUSCULAR | Status: DC | PRN
Start: 2024-04-17 — End: 2024-04-21
  Administered 2024-04-21: 4 mg via INTRAVENOUS
  Filled 2024-04-17: qty 2

## 2024-04-17 MED ORDER — LORAZEPAM 2 MG/ML IJ SOLN
1.0000 mg | Freq: Once | INTRAMUSCULAR | Status: AC
  Administered 2024-04-17: 1 mg via INTRAVENOUS
  Filled 2024-04-17: qty 1

## 2024-04-17 MED ORDER — DULOXETINE HCL 60 MG PO CPEP
60.0000 mg | ORAL_CAPSULE | Freq: Every day | ORAL | Status: DC
  Administered 2024-04-17 – 2024-04-21 (×5): 60 mg via ORAL
  Filled 2024-04-17 (×5): qty 1

## 2024-04-17 MED ORDER — SIMVASTATIN 20 MG PO TABS
40.0000 mg | ORAL_TABLET | Freq: Every day | ORAL | Status: DC
  Administered 2024-04-17 – 2024-04-20 (×4): 40 mg via ORAL
  Filled 2024-04-17 (×4): qty 2

## 2024-04-17 MED ORDER — ONDANSETRON HCL 4 MG/2ML IJ SOLN: 4.0000 mg | Freq: Once | INTRAMUSCULAR | Status: DC

## 2024-04-17 MED ORDER — CARVEDILOL 3.125 MG PO TABS
3.1250 mg | ORAL_TABLET | Freq: Two times a day (BID) | ORAL | Status: DC
Start: 2024-04-17 — End: 2024-04-18
  Administered 2024-04-17 – 2024-04-18 (×2): 3.125 mg via ORAL
  Filled 2024-04-17 (×2): qty 1

## 2024-04-17 MED ORDER — PROCHLORPERAZINE EDISYLATE 10 MG/2ML IJ SOLN
10.0000 mg | Freq: Once | INTRAMUSCULAR | Status: AC
  Administered 2024-04-17: 10 mg via INTRAVENOUS
  Filled 2024-04-17: qty 2

## 2024-04-17 MED ORDER — IRBESARTAN 150 MG PO TABS
150.0000 mg | ORAL_TABLET | Freq: Every day | ORAL | Status: DC
  Administered 2024-04-17 – 2024-04-19 (×3): 150 mg via ORAL
  Filled 2024-04-17 (×3): qty 1

## 2024-04-17 MED ORDER — DIPHENHYDRAMINE HCL 50 MG/ML IJ SOLN
25.0000 mg | Freq: Once | INTRAMUSCULAR | Status: AC
  Administered 2024-04-17: 25 mg via INTRAVENOUS

## 2024-04-17 MED ORDER — AMLODIPINE BESYLATE 5 MG PO TABS
5.0000 mg | ORAL_TABLET | Freq: Every day | ORAL | Status: DC
Start: 2024-04-17 — End: 2024-04-18
  Administered 2024-04-17: 5 mg via ORAL
  Filled 2024-04-17: qty 1

## 2024-04-17 NOTE — ED Notes (Signed)
 Called [facility] for transport 13:01 TC

## 2024-04-17 NOTE — Consult Note (Cosign Needed)
 Cardiology Consultation   Patient ID: Pamela Robinson MRN: [id number]; DOB: 1932-08-23  Admit date: 04/16/2024 Date of Consult: 04/17/2024  PCP:  Vernon Velna SAUNDERS, MD    [facility]Care Providers Cardiologist:  Ozell Fell, MD        Patient Profile: Pamela Robinson is a 89 y.o. female with a hx of hypertension, mild aortic stenosis, hyperlipidemia, and osteoarthritis who is being seen 04/17/2024 for the evaluation of atrial fibrillation at the request of Nilda Fendt MD.  History of Present Illness: Pamela Robinson follows with Dr. Fell for her mild aortic stenosis.  Last echocardiogram was 09/2021 which showed LVEF 6065%.  Moderate LVH.  G1 DD.  Mildly elevated PASP 38.3 mmHg.  Moderately dilated LA.  Mildly dilated RA.  Moderate TR.  Moderate MAC with mild MR.  Presenting today for hypertension.  Noted having systolic over 190 this weekend.  She went to her PCP was noted to be in atrial fibrillation and was advised to present to the ED.  Has noticed fatigue, dizziness, lightheadedness, and shortness of breath. On arrival to the ED: BP: 158/76, though blood pressure has reached 207/105 ECG atrial fibrillation with IVCD, VR 64 CTA chest showed no evidence of PE.  Large bilateral pleural effusions with atelectasis.  And cardiomegaly with evidence of right heart failure. Echocardiogram pending  Lab work: K3.0, repleted Cr 1.04 D-dimer 3.33 proBNP 11,800 Negative troponin  She has received 1 dose of IV Lasix  40 mg and has now been placed on IV heparin . Reported good urine output with mild improvement in breathing.   On interview, patient shared on Saturday she had a sharp pain to her left side with associated shortness of breath. The pain was intermittent for about an hour then went away. This weekend she has been not feeling like herself, fatigue and shortness of breath. Has not noticed orthopnea or PND. Appetite okay. No peripheral edema. No reoccurrence of chest  pain.  Denied recent illness or sick contact.   Past Medical History:  Diagnosis Date   Acute CHF (congestive heart failure) (HCC) 04/16/2024   Basal cell carcinoma 12/10/2019   right malar cheek mohs   Disorder of bone density and structure, unspecified    Diverticulitis    Dysuria    Fatigue    Heart murmur    Hypercholesterolemia    Hyperlipidemia    Hypertension    Mild mood disorder    Primary generalized (osteo)arthritis    Sleep disorder    Spinal stenosis of thoracic region    Tension headache     Past Surgical History:  Procedure Laterality Date   ABDOMINAL HYSTERECTOMY     APPENDECTOMY     BLADDER SUSPENSION     TONSILLECTOMY         Scheduled Meds:  amLODipine   5 mg Oral Daily   atenolol   100 mg Oral BID   diphenhydrAMINE        Continuous Infusions:  heparin  900 Units/hr (04/17/24 0718)   PRN Meds: diphenhydrAMINE   Allergies:    Allergies  Allergen Reactions   Shellfish Allergy Swelling    Other Reaction(s): Not available   Codeine Nausea And Vomiting    Other Reaction(s): Not available, Unknown  Other Reaction(s): Not available, Not available, Not available   Sulfa Antibiotics Nausea And Vomiting    Other Reaction(s): Not available  Other Reaction(s): Unknown   Angiotensin Receptor Blockers Cough   Shellfish Protein-Containing Drug Products     Other Reaction(s): Unknown    Social  History:   Social History   Socioeconomic History   Marital status: Widowed    Spouse name: Not on file   Number of children: 1   Years of education: Not on file   Highest education level: Not on file  Occupational History   Not on file  Tobacco Use   Smoking status: Never   Smokeless tobacco: Never  Vaping Use   Vaping status: Never Used  Substance and Sexual Activity   Alcohol use: Yes    Alcohol/week: 7.0 standard drinks of alcohol    Types: 7 Glasses of wine per week    Comment: evening wine   Drug use: Not Currently   Sexual activity: Not  Currently  Other Topics Concern   Not on file  Social History Narrative   Not on file   Social Drivers of Health   Financial Resource Strain: Not on file  Food Insecurity: Not on file  Transportation Needs: Not on file  Physical Activity: Not on file  Stress: Not on file  Social Connections: Not on file  Intimate Partner Violence: Not on file    Family History:   History reviewed. No pertinent family history.   ROS:  Please see the history of present illness.  All other ROS reviewed and negative.     Physical Exam/Data: Vitals:   04/17/24 1215 04/17/24 1230 04/17/24 1300 04/17/24 1424  BP:  (!) 175/103 (!) 159/102 (!) 184/99  Pulse:  71 74 82  Resp: 19  15 16   Temp:    97.7 F (36.5 C)  TempSrc:    Oral  SpO2:  97% 100% 95%  Weight:      Height:        Intake/Output Summary (Last 24 hours) at 04/17/2024 1449 Last data filed at 04/17/2024 1343 Gross per 24 hour  Intake --  Output 3000 ml  Net -3000 ml      04/16/2024   11:41 PM 06/06/2023    3:39 PM 10/11/2022   11:47 AM  Last 3 Weights  Weight (lbs) 137 lb 12.6 oz 142 lb 148 lb  Weight (kg) 62.5 kg 64.411 kg 67.132 kg     Body mass index is 25.23 kg/m.  General:  Pleasant older woman in no acute distress though appears uncomfortable HEENT: normal Neck: JVD Vascular: Distal pulses 2+ bilaterally Cardiac:  normal S1, S2; RRR; no murmur  Lungs:  clear to auscultation bilaterally, no wheezing, rhonchi or rales  Abd: soft, nontender,  Ext: no edema Musculoskeletal:  No deformities Skin: warm and dry  Psych:  Normal affect   EKG:  The EKG was personally reviewed and demonstrates: See HPI Telemetry:  Telemetry was personally reviewed and demonstrates:  rate controlled AF HR 75  Relevant CV Studies: Echocardiogram 09/2021: EF 60-65%. No RWMA. Mod LVH. Gr 1 DD.Mildly elevated RVSP/PAP.  Mod LA and mild RA dilation. Mild MR with Mod MAC. Mild AS & AI.  Normal RAP.   Laboratory Data:  Chemistry Recent Labs   Lab 04/16/24 1812 04/16/24 1920  NA 139  --   K 3.0*  --   CL 98  --   CO2 29  --   GLUCOSE 112*  --   BUN 12  --   CREATININE 1.04*  --   CALCIUM 9.3  --   MG  --  2.0  GFRNONAA 51*  --   ANIONGAP 12  --     Hematology Recent Labs  Lab 04/16/24 1812 04/17/24 0507  WBC  9.1 11.4*  RBC 4.37 4.66  HGB 12.9 13.8  HCT 37.9 39.5  MCV 86.7 84.8  MCH 29.5 29.6  MCHC 34.0 34.9  RDW 14.5 14.6  PLT 237 251   BNP Recent Labs  Lab 04/16/24 1920  PROBNP 11,800.0*    DDimer  Recent Labs  Lab 04/16/24 1940  DDIMER 3.33*    Radiology/Studies:  CT Angio Chest PE W and/or Wo Contrast Result Date: 04/16/2024 EXAM: CTA of the Chest with contrast for PE 04/16/2024 08:38:17 PM TECHNIQUE: CTA of the chest was performed after the administration of intravenous contrast. Multiplanar reformatted images are provided for review. MIP images are provided for review. Automated exposure control, iterative reconstruction, and/or weight based adjustment of the mA/kV was utilized to reduce the radiation dose to as low as reasonably achievable. COMPARISON: Chest radiograph 04/16/2024 CLINICAL HISTORY: Pulmonary embolism (PE) suspected, low to intermediate prob, positive D-dimer. FINDINGS: LIMITATIONS/ARTIFACTS: Moderate motion artifact limits examination. PULMONARY ARTERIES: Pulmonary arteries are adequately opacified for evaluation. No pulmonary embolism. Main pulmonary artery is normal in caliber. MEDIASTINUM: Cardiac enlargement with particular right heart enlargement. Reflux of contrast material into the hepatic vein suggests right heart failure. No pericardial effusions. Calcification throughout the aorta and coronary arteries. LYMPH NODES: No mediastinal, hilar or axillary lymphadenopathy. LUNGS AND PLEURA: Large bilateral pleural effusions with basilar atelectasis. No pneumothorax. UPPER ABDOMEN: Limited images of the upper abdomen demonstrate reflux of contrast material into the hepatic vein,  suggesting right heart failure. SOFT TISSUES AND BONES: Degenerative changes in the spine. No acute bone or soft tissue abnormality. IMPRESSION: 1. No pulmonary embolism. 2. Large bilateral pleural effusions with basilar atelectasis. 3. Cardiomegaly with right heart enlargement and reflux of contrast into the hepatic veins, suggestive of right heart failure. Electronically signed by: Elsie Gravely MD 04/16/2024 08:44 PM EST RP Workstation: [id number]   DG Chest 2 View Result Date: 04/16/2024 CLINICAL DATA:  Chest pain, hypertension EXAM: CHEST - 2 VIEW COMPARISON:  09/25/2019 FINDINGS: Frontal and lateral views of the chest demonstrate an unremarkable cardiac silhouette. There is bibasilar consolidation with small bilateral pleural effusions, right greater than left. No pneumothorax. No acute bony abnormalities. Stable compression deformities at the thoracolumbar junction. IMPRESSION: 1. Bibasilar consolidation, right greater than left, which may reflect airspace disease or atelectasis. 2. Small bilateral pleural effusions, right greater than left. Electronically Signed   By: Ozell Daring M.D.   On: 04/16/2024 19:28     Assessment and Plan:  Atrial fibrillation - New Onset No prior diagnosis  CHA2DS2-VASc score 5, echo pending Continue IV heparin  for now  Stop atenolol  and start coreg  3.25 BID mg, patient was taking BB prior to admission.  Will get TSH Follow up on echocardiogram  Keep K > 4 and mag > 2 Will switch to DOAC potentially tomorrow, would like to see echo results prior to.  Currently in, rate controlled AF - consider restoring Rhythm with DCCV if Sx HF Sx continue to persist - woud need a TEE given unknown chronicity.   Volume Overload - Most likely HF related to Afib -- At least Diastolic with HTN Urgency = Echo Pending.  Pro-BNP elevated. Imaging showed evidence of volume overload. On exam, appears volume up Continue IV lasix  40 mg BID with Strict I/Os and daily weights.   Follow up on echocardiogram  Will obtain LFTs Will avoid IV nitroglycerin  for BP giving CT showing concern for possible RV failure  Hypertensive urgency: BP: 184/99 Continue IV diuresis Continue Irbesartan  150 mg along with  amlodipine  5 mg, received first dose this morning BB: Convert from Atenolol  to Carvedilol     Aortic stenosis Mild as above 2023. Could be contributing to above, though will follow-up on echocardiogram.  Hyperlipidemia Will get lipid panel in the am. LFTs pending Continue zocor  40 mg  Coronary Calcifications on CT Will defer antiplatelet with chronic anticoagulation  Per primary Osteoarthritis Mild leukocytosis CKD follows with [facility]  Risk Assessment/Risk Scores:       CHA2DS2-VASc Score = 4   This indicates a 4.8% annual risk of stroke. The patient's score is based upon: CHF History: 0 HTN History: 1 Diabetes History: 0 Stroke History: 0 Vascular Disease History: 0 Age Score: 2 Gender Score: 1     For questions or updates, please contact [facility] Please consult [email or web address] for contact info under     Signed, Leontine LOISE Salen, PA-C  04/17/2024 2:49 PM   ATTENDING ATTESTATION  I have seen, examined and evaluated the patient this PM along with Leontine Salen, PA-C.  After reviewing all the available data and chart, we discussed the patients laboratory, study & physical findings as well as symptoms in detail.  I agree with her findings, examination as well as impression recommendations as per our discussion.    Attending adjustments noted in italics.   - 89 y/o woman presenting with Volume Overload (~ DHF until proven otherwise) in setting of New Onset Afib & HTN Urgency  Afib rate controlled with Atenolol  - > now converted to Carvedilol  for better BP control -> Convert from IV Heparin  to DOAC in AM & consider TEE/DCCV vs. Rate control/BP control & diuresis with 4 wks OAC followed by OP DCCV. Continue to gently  diurese & adjust meds for BP control.     Alm MICAEL Clay, MD, MS Alm Clay, M.D., M.S. Interventional Cardiologist  [facility] Pager # [phone]

## 2024-04-17 NOTE — Plan of Care (Signed)
  Problem: Education: Goal: Knowledge of General Education information will improve Description: Including pain rating scale, medication(s)/side effects and non-pharmacologic comfort measures Outcome: Progressing   Problem: Health Behavior/Discharge Planning: Goal: Ability to manage health-related needs will improve Outcome: Progressing   Problem: Clinical Measurements: Goal: Ability to maintain clinical measurements within normal limits will improve Outcome: Progressing   Problem: Clinical Measurements: Goal: Will remain free from infection Outcome: Progressing   Problem: Clinical Measurements: Goal: Diagnostic test results will improve Outcome: Progressing   Problem: Clinical Measurements: Goal: Respiratory complications will improve Outcome: Progressing   Problem: Clinical Measurements: Goal: Cardiovascular complication will be avoided Outcome: Progressing   Problem: Safety: Goal: Ability to remain free from injury will improve Outcome: Progressing   Problem: Skin Integrity: Goal: Risk for impaired skin integrity will decrease Outcome: Progressing

## 2024-04-17 NOTE — H&P (Signed)
 History and Physical    Pamela Robinson FMW:[id number] DOB: June 12, 1932 DOA: 04/16/2024  I have briefly reviewed the patient's prior medical records in [facility]  PCP: Vernon Velna SAUNDERS, MD  Patient coming from: home  Chief Complaint: shortness of breath   HPI: Pamela Robinson is a 89 y.o. female with medical history significant of hypertension, hyperlipidemia, osteoarthritis, who comes to the hospital with shortness of breath and elevated blood pressure.  Patient tells me that over the last 3 days or so has been experiencing increased shortness of breath, weakness, fatigue.  She denies any chest pain or palpitations, however did complain of pretty severe left-sided flank pain on Saturday.  She went to see her PCP, and when she was evaluated and EKG was found to be abnormal with findings of A-fib.  She later came into the emergency room.  Currently she denies any chest pain, denies any palpitations, no fever or chills.  No abdominal discomfort, no nausea or vomiting.  She has no flank pain today.  She denies any dysuria.  ED Course: In the emergency room she is afebrile, heart rate is rate controlled into the 80s, she is in A-fib, was found to be hypertensive with a blood pressure of 200 systolic originally.  She was given Lasix , nitroglycerin , and heparin  infusion for new onset A-fib and was admitted to the hospital.  CT angiogram was negative for PE but it did show large bilateral pleural effusions with basilar atelectasis, cardiomegaly and reflux of contrast into hepatic veins suggestive of right heart failure.  Blood work reveals an elevated proBNP, negative troponins, potassium of 3.0.  Of note, she presented to the ED at 6 PM on 04/16/2024 and was evaluated by the hospitalist service on 04/17/2024 at 3 PM and she has had no follow-up labs from when she was admitted.  Review of Systems: All systems reviewed, and apart from HPI, all negative  Past Medical History:  Diagnosis Date    Acute CHF (congestive heart failure) (HCC) 04/16/2024   Basal cell carcinoma 12/10/2019   right malar cheek mohs   Disorder of bone density and structure, unspecified    Diverticulitis    Dysuria    Fatigue    Heart murmur    Hypercholesterolemia    Hyperlipidemia    Hypertension    Mild mood disorder    Primary generalized (osteo)arthritis    Sleep disorder    Spinal stenosis of thoracic region    Tension headache     Past Surgical History:  Procedure Laterality Date   ABDOMINAL HYSTERECTOMY     APPENDECTOMY     BLADDER SUSPENSION     TONSILLECTOMY       reports that she has never smoked. She has never used smokeless tobacco. She reports current alcohol use of about 7.0 standard drinks of alcohol per week. She reports that she does not currently use drugs.  Allergies  Allergen Reactions   Shellfish Allergy Swelling    Other Reaction(s): Not available   Codeine Nausea And Vomiting    Other Reaction(s): Not available, Unknown  Other Reaction(s): Not available, Not available, Not available   Sulfa Antibiotics Nausea And Vomiting    Other Reaction(s): Not available  Other Reaction(s): Unknown   Angiotensin Receptor Blockers Cough   Shellfish Protein-Containing Drug Products     Other Reaction(s): Unknown    History reviewed. No pertinent family history.  Prior to Admission medications   Medication Sig Start Date End Date Taking? Authorizing Provider  Ascorbic Acid (VITAMIN C PO) Take 1 tablet by mouth daily.   Yes [provider]  atenolol  (TENORMIN ) 100 MG tablet Take 100 mg by mouth 2 (two) times daily.   Yes [provider]  BIOTIN PO Take by mouth.   Yes [provider]  Cyanocobalamin  (VITAMIN B-12 PO) Take 1 tablet by mouth daily.   Yes [provider]  DULoxetine  (CYMBALTA ) 60 MG capsule Take 60 mg by mouth daily. 05/25/23  Yes [provider]  mirtazapine  (REMERON ) 15 MG tablet 3 TABLETS AT BEDTIME ORALLY 90 DAYS  09/22/17  Yes [provider]  Oxycodone  HCl 20 MG TABS Take 1 tablet by mouth 3 (three) times daily as needed. 09/10/21  Yes [provider]  polyethylene glycol (MIRALAX) 17 g packet Take 17 g by mouth daily as needed. 06/21/23  Yes [provider]  pregabalin  (LYRICA ) 50 MG capsule Take 1 capsule (50 mg total) by mouth at bedtime. APPOINTMENT NEEDED FOR FURTHER REFILLS 09/29/23  Yes Onita Duos, MD  pregabalin  (LYRICA ) 50 MG capsule Take 1 capsule (50 mg total) by mouth at bedtime. 09/28/23  Yes Onita Duos, MD  simvastatin  (ZOCOR ) 40 MG tablet TAKE 1 TABLET BY MOUTH EVERY EVENING ONCE A DAY 10/21/17  Yes [provider]  valsartan (DIOVAN) 320 MG tablet Take 320 mg by mouth daily. 03/21/24  Yes [provider]  VITAMIN D PO Take 1 tablet by mouth daily.   Yes [provider]  VITAMIN E PO Take 1 tablet by mouth daily.   Yes [provider]  amLODipine  (NORVASC ) 5 MG tablet TAKE 1 TABLET (5 MG TOTAL) BY MOUTH DAILY. 06/28/23   Wonda Sharper, MD  amoxicillin -clavulanate (AUGMENTIN ) 875-125 MG tablet Take 1 tablet by mouth every 12 (twelve) hours. Patient not taking: Reported on 04/17/2024 12/14/23   Billy Asberry FALCON, PA-C  naloxone [facility]) nasal spray 4 mg/0.1 mL Place 1 spray into the nose once. 02/02/23   [provider]    Physical Exam: Vitals:   04/17/24 1215 04/17/24 1230 04/17/24 1300 04/17/24 1424  BP:  (!) 175/103 (!) 159/102 (!) 184/99  Pulse:  71 74 82  Resp: 19  15 16   Temp:    97.7 F (36.5 C)  TempSrc:    Oral  SpO2:  97% 100% 95%  Weight:      Height:        Constitutional: NAD, calm, comfortable Eyes: PERRL, lids and conjunctivae normal ENMT: Mucous membranes are moist.   Neck: normal, supple Respiratory: Diminished breath sounds at the bases, no wheezing heard.  Faint crackles heard Cardiovascular: Irregularly irregular, 3/6 SEM, 1+ pitting lower extremity edema Abdomen: no tenderness, no masses  palpated. Bowel sounds positive.  Musculoskeletal: no clubbing / cyanosis. Normal muscle tone.  Skin: no rashes, lesions, ulcers. No induration Neurologic: No focal deficits, equal strength  Labs on Admission: I have personally reviewed following labs and imaging studies  CBC: Recent Labs  Lab 04/16/24 1812 04/17/24 0507  WBC 9.1 11.4*  HGB 12.9 13.8  HCT 37.9 39.5  MCV 86.7 84.8  PLT 237 251   Basic Metabolic Panel: Recent Labs  Lab 04/16/24 1812 04/16/24 1920  NA 139  --   K 3.0*  --   CL 98  --   CO2 29  --   GLUCOSE 112*  --   BUN 12  --   CREATININE 1.04*  --   CALCIUM 9.3  --   MG  --  2.0  Liver Function Tests: No results for input(s): AST, ALT, ALKPHOS, BILITOT, PROT, ALBUMIN  in the last 168 hours. Coagulation Profile: No results for input(s): INR, PROTIME in the last 168 hours. BNP (last 3 results) Recent Labs    04/16/24 1920  PROBNP 11,800.0*   CBG: No results for input(s): GLUCAP in the last 168 hours. Thyroid  Function Tests: No results for input(s): TSH, T4TOTAL, FREET4, T3FREE, THYROIDAB in the last 72 hours. Urine analysis:    Component Value Date/Time   COLORURINE YELLOW 08/22/2013 1502   APPEARANCEUR CLOUDY (A) 08/22/2013 1502   LABSPEC 1.015 08/22/2013 1502   PHURINE 6.0 08/22/2013 1502   GLUCOSEU NEGATIVE 08/22/2013 1502   HGBUR NEGATIVE 08/22/2013 1502   BILIRUBINUR NEGATIVE 08/22/2013 1502   KETONESUR NEGATIVE 08/22/2013 1502   PROTEINUR NEGATIVE 08/22/2013 1502   UROBILINOGEN 0.2 08/22/2013 1502   NITRITE NEGATIVE 08/22/2013 1502   LEUKOCYTESUR SMALL (A) 08/22/2013 1502     Radiological Exams on Admission: CT Angio Chest PE W and/or Wo Contrast Result Date: 04/16/2024 EXAM: CTA of the Chest with contrast for PE 04/16/2024 08:38:17 PM TECHNIQUE: CTA of the chest was performed after the administration of intravenous contrast. Multiplanar reformatted images are provided for review. MIP images are  provided for review. Automated exposure control, iterative reconstruction, and/or weight based adjustment of the mA/kV was utilized to reduce the radiation dose to as low as reasonably achievable. COMPARISON: Chest radiograph 04/16/2024 CLINICAL HISTORY: Pulmonary embolism (PE) suspected, low to intermediate prob, positive D-dimer. FINDINGS: LIMITATIONS/ARTIFACTS: Moderate motion artifact limits examination. PULMONARY ARTERIES: Pulmonary arteries are adequately opacified for evaluation. No pulmonary embolism. Main pulmonary artery is normal in caliber. MEDIASTINUM: Cardiac enlargement with particular right heart enlargement. Reflux of contrast material into the hepatic vein suggests right heart failure. No pericardial effusions. Calcification throughout the aorta and coronary arteries. LYMPH NODES: No mediastinal, hilar or axillary lymphadenopathy. LUNGS AND PLEURA: Large bilateral pleural effusions with basilar atelectasis. No pneumothorax. UPPER ABDOMEN: Limited images of the upper abdomen demonstrate reflux of contrast material into the hepatic vein, suggesting right heart failure. SOFT TISSUES AND BONES: Degenerative changes in the spine. No acute bone or soft tissue abnormality. IMPRESSION: 1. No pulmonary embolism. 2. Large bilateral pleural effusions with basilar atelectasis. 3. Cardiomegaly with right heart enlargement and reflux of contrast into the hepatic veins, suggestive of right heart failure. Electronically signed by: Elsie Gravely MD 04/16/2024 08:44 PM EST RP Workstation: [id number]   DG Chest 2 View Result Date: 04/16/2024 CLINICAL DATA:  Chest pain, hypertension EXAM: CHEST - 2 VIEW COMPARISON:  09/25/2019 FINDINGS: Frontal and lateral views of the chest demonstrate an unremarkable cardiac silhouette. There is bibasilar consolidation with small bilateral pleural effusions, right greater than left. No pneumothorax. No acute bony abnormalities. Stable compression deformities at the  thoracolumbar junction. IMPRESSION: 1. Bibasilar consolidation, right greater than left, which may reflect airspace disease or atelectasis. 2. Small bilateral pleural effusions, right greater than left. Electronically Signed   By: Ozell Daring M.D.   On: 04/16/2024 19:28    EKG: Independently reviewed. A fib, LBBB  Assessment/Plan Principal problem Acute CHF, unknown type -patient with evidence of fluid overload with bilateral pleural effusions, concern for RV failure with CT scan, elevated BNP - She was given furosemide  last night on admission, no doses since, will repeat furosemide  as she continues to remain fluid overloaded - Previous 2D echo in 2023 shows LVEF 60-65%, no WMA, grade 1 diastolic dysfunction and normal RV.  Repeat 2D echo has been ordered and is  pending.  She has known mild aortic stenosis as well - Cardiology consulted, appreciate input  Active problems Hypertension-blood pressure quite high at times, resume home medications, will be given IV Lasix  as well  CKD 3A-creatinine 1.0 on admission which I suspect is her baseline.  Prior creatinine in our system back in 2021.  She is established with [facility] per patient.  I do not have easy access to her prior creatinine values  A-fib, new onset-patient was started on heparin , she currently appears rate controlled.  Continue anticoagulation, seems to be tolerating well, cardiology consulted as above and 2D echo is pending  Hyperlipidemia-continue home statin  Depression-continue duloxetine , mirtazapine   Chronic osteoarthritis-resume home pain medications  DVT prophylaxis: On heparin  infusion Code Status: Full code per patient, she is unsure and wishes to speak with her son later on Family Communication: No family at bedside Bed Type: Progressive Consults called: Cardiology Obs/Inp: Inpatient  At the time of admission, it appears that the appropriate admission status for this patient is INPATIENT as it is  expected that patient will require hospital care > 2 midnights. This is judged to be reasonable and necessary in order to provide the required intensity of service to ensure the patient's safety given: presenting symptoms, initial radiographic and laboratory data and in the context of their chronic comorbidities. Together, these circumstances are felt to place patient at high at high risk for further clinical deterioration threatening life, limb, or organ.  Nilda Fendt, MD, PhD [facility]  Contact via [email or web address]  04/17/2024, 2:59 PM

## 2024-04-17 NOTE — Progress Notes (Addendum)
 ANTICOAGULATION CONSULT NOTE  Pharmacy Consult for Heparin  Indication: atrial fibrillation  Patient Measurements: Height: 5' 1.97 (157.4 cm) Weight: 62.5 kg (137 lb 12.6 oz) IBW/kg (Calculated) : 50.03 Heparin  Dosing Weight: 62 kg  Vital Signs: Temp: 98.5 F (36.9 C) (11/25 0652) Temp Source: Oral (11/25 9347) BP: 167/94 (11/25 0800) Pulse Rate: 71 (11/25 0800)  Labs: Recent Labs    04/16/24 1812 04/17/24 0507  HGB 12.9 13.8  HCT 37.9 39.5  PLT 237 251  HEPARINUNFRC  --  0.54  CREATININE 1.04*  --     Estimated Creatinine Clearance: 31.8 mL/min (A) (by C-G formula based on SCr of 1.04 mg/dL (H)).   Medications:  Infusions:   heparin  900 Units/hr (04/17/24 9281)   Assessment: 83 yof with a history of HTN. Patient is presenting with SOB. Noted to have new onset AF at PCP office today. Heparin  per pharmacy consult placed for atrial fibrillation. No evidence of PE on CTA. Patient is not on anticoagulation prior to arrival. CBC is WNL. Initial heparin  level is therapeutic at 0.54 but was drawn early. No bleeding or other issues noted.   Goal of Therapy:  Heparin  level 0.3-0.7 units/ml Monitor platelets by anticoagulation protocol: Yes   Plan:  Continue heparin  infusion at 900 units/hr Check a heparin  level today to confirm dosing Daily heparin  level and CBC  Vernell Meier, PharmD, BCPS, BCEMP Clinical Pharmacist Please see AMION for all pharmacy numbers 04/17/2024 8:07 AM  Addendum: Repeat heparin  level is therapeutic at 0.59. Continue heparin  at current rate.   Vernell Meier, PharmD, BCPS, BCEMP Clinical Pharmacist Please see AMION for all pharmacy numbers 04/17/2024 1:29 PM

## 2024-04-17 NOTE — ED Notes (Signed)
 [facility] arrived to transport pt to [facility]. Heparin  gtt remains infusing at 29ml/hr. Pt in stable condition. VSS. Attempted to contact son, Lorrene and was unavailable.

## 2024-04-18 ENCOUNTER — Telehealth (HOSPITAL_COMMUNITY): Payer: Self-pay

## 2024-04-18 ENCOUNTER — Other Ambulatory Visit (HOSPITAL_COMMUNITY): Payer: Self-pay

## 2024-04-18 ENCOUNTER — Inpatient Hospital Stay (HOSPITAL_COMMUNITY)

## 2024-04-18 DIAGNOSIS — I5033 Acute on chronic diastolic (congestive) heart failure: Secondary | ICD-10-CM | POA: Diagnosis not present

## 2024-04-18 DIAGNOSIS — I10 Essential (primary) hypertension: Secondary | ICD-10-CM | POA: Diagnosis not present

## 2024-04-18 DIAGNOSIS — I4891 Unspecified atrial fibrillation: Secondary | ICD-10-CM

## 2024-04-18 DIAGNOSIS — I5021 Acute systolic (congestive) heart failure: Secondary | ICD-10-CM | POA: Diagnosis not present

## 2024-04-18 DIAGNOSIS — I509 Heart failure, unspecified: Secondary | ICD-10-CM

## 2024-04-18 DIAGNOSIS — I350 Nonrheumatic aortic (valve) stenosis: Secondary | ICD-10-CM | POA: Diagnosis not present

## 2024-04-18 LAB — CBC
HCT: 35.6 % — ABNORMAL LOW (ref 36.0–46.0)
Hemoglobin: 12.1 g/dL (ref 12.0–15.0)
MCH: 29.2 pg (ref 26.0–34.0)
MCHC: 34 g/dL (ref 30.0–36.0)
MCV: 85.8 fL (ref 80.0–100.0)
Platelets: 195 K/uL (ref 150–400)
RBC: 4.15 MIL/uL (ref 3.87–5.11)
RDW: 14.6 % (ref 11.5–15.5)
WBC: 9.4 K/uL (ref 4.0–10.5)
nRBC: 0 % (ref 0.0–0.2)

## 2024-04-18 LAB — BASIC METABOLIC PANEL WITH GFR
Anion gap: 16 — ABNORMAL HIGH (ref 5–15)
BUN: 12 mg/dL (ref 8–23)
CO2: 26 mmol/L (ref 22–32)
Calcium: 8.6 mg/dL — ABNORMAL LOW (ref 8.9–10.3)
Chloride: 96 mmol/L — ABNORMAL LOW (ref 98–111)
Creatinine, Ser: 1.29 mg/dL — ABNORMAL HIGH (ref 0.44–1.00)
GFR, Estimated: 40 mL/min — ABNORMAL LOW (ref >60)
Glucose, Bld: 100 mg/dL — ABNORMAL HIGH (ref 70–99)
Potassium: 2.8 mmol/L — ABNORMAL LOW (ref 3.5–5.1)
Sodium: 138 mmol/L (ref 135–145)

## 2024-04-18 LAB — LIPID PANEL
Cholesterol: 116 mg/dL (ref 0–200)
HDL: 49 mg/dL (ref >40)
LDL Cholesterol: 55 mg/dL (ref 0–99)
Total CHOL/HDL Ratio: 2.4 ratio
Triglycerides: 62 mg/dL (ref <150)
VLDL: 12 mg/dL (ref 0–40)

## 2024-04-18 LAB — HEPARIN LEVEL (UNFRACTIONATED): Heparin Unfractionated: 0.59 [IU]/mL (ref 0.30–0.70)

## 2024-04-18 LAB — ECHOCARDIOGRAM COMPLETE
Area-P 1/2: 4.21 cm2
Height: 63 in
MV M vel: 4.01 m/s
MV Peak grad: 64.3 mmHg
S' Lateral: 2.6 cm
Weight: 1971.79 [oz_av]

## 2024-04-18 LAB — MRSA NEXT GEN BY PCR, NASAL: MRSA by PCR Next Gen: NOT DETECTED

## 2024-04-18 LAB — MAGNESIUM: Magnesium: 1.7 mg/dL (ref 1.7–2.4)

## 2024-04-18 MED ORDER — MAGNESIUM SULFATE 2 GM/50ML IV SOLN
2.0000 g | Freq: Once | INTRAVENOUS | Status: AC
  Administered 2024-04-18: 2 g via INTRAVENOUS
  Filled 2024-04-18: qty 50

## 2024-04-18 MED ORDER — SPIRONOLACTONE 25 MG PO TABS
25.0000 mg | ORAL_TABLET | Freq: Every day | ORAL | Status: DC
  Administered 2024-04-18 – 2024-04-21 (×4): 25 mg via ORAL
  Filled 2024-04-18 (×4): qty 1

## 2024-04-18 MED ORDER — POTASSIUM CHLORIDE CRYS ER 20 MEQ PO TBCR
40.0000 meq | EXTENDED_RELEASE_TABLET | ORAL | Status: AC
  Administered 2024-04-18 (×3): 40 meq via ORAL
  Filled 2024-04-18 (×3): qty 2

## 2024-04-18 MED ORDER — CARVEDILOL 6.25 MG PO TABS
6.2500 mg | ORAL_TABLET | Freq: Two times a day (BID) | ORAL | Status: DC
  Administered 2024-04-18 – 2024-04-20 (×5): 6.25 mg via ORAL
  Filled 2024-04-18 (×6): qty 1

## 2024-04-18 MED ORDER — POTASSIUM CHLORIDE CRYS ER 20 MEQ PO TBCR: 40.0000 meq | EXTENDED_RELEASE_TABLET | ORAL | Status: DC

## 2024-04-18 NOTE — Progress Notes (Signed)
 ANTICOAGULATION CONSULT NOTE  Pharmacy Consult for Heparin  Indication: atrial fibrillation  Patient Measurements: Height: 5' 3 (160 cm) Weight: 55.9 kg (123 lb 3.8 oz) IBW/kg (Calculated) : 52.4 Heparin  Dosing Weight: 62 kg  Vital Signs: Temp: 98 F (36.7 C) (11/26 0441) Temp Source: Oral (11/26 0441) BP: 144/58 (11/26 0441) Pulse Rate: 65 (11/26 0441)  Labs: Recent Labs    04/16/24 1812 04/17/24 0507 04/17/24 1138 04/17/24 1506 04/18/24 0426  HGB 12.9 13.8  --   --  12.1  HCT 37.9 39.5  --   --  35.6*  PLT 237 251  --   --  195  HEPARINUNFRC  --  0.54 0.59  --  0.59  CREATININE 1.04*  --   --  1.03* 1.29*    Estimated Creatinine Clearance: 24.5 mL/min (A) (by C-G formula based on SCr of 1.29 mg/dL (H)).   Medications:  Infusions:   sodium chloride      heparin  900 Units/hr (04/17/24 1959)   Assessment: 8 yof with a history of HTN. Patient is presenting with SOB. Noted to have new onset AF at PCP office today. Heparin  per pharmacy consult placed for atrial fibrillation. No evidence of PE on CTA. Patient is not on anticoagulation prior to arrival. CBC is WNL.   Heparin  level 0.59 therapeutic on 900 units/hr. CBC stable. Per RN, no signs of bleeding or interruptions to heparin  infusion.  Goal of Therapy:  Heparin  level 0.3-0.7 units/ml Monitor platelets by anticoagulation protocol: Yes   Plan:  Continue heparin  infusion at 900 units/hr Daily heparin  level and CBC  Dionicia Canavan, PharmD, RPh PGY1 Acute Care Pharmacy Resident [facility]  04/18/2024 7:21 AM

## 2024-04-18 NOTE — Progress Notes (Signed)
 PROGRESS NOTE    Pamela Robinson  FMW:[id number] DOB: 1933-05-07 DOA: 04/16/2024 PCP: Vernon Velna SAUNDERS, MD   89/F w history of hypertension, hyperlipidemia, osteoarthritis, presented to the ED with worsening dyspnea for 3 to 4 days .  Went to see her PCP, noted to be in A-fib, sent to the ED .  In the ED noted to be hypertensive with rate controlled A-fib,, labs noted D-dimer 3.3, proBNP 11.8 K, troponin negative, CTA chest noted no PE, large bilateral effusions with atelectasis, concern for right heart failure - Admitted, started on diuretics, echo pending, cards following  Subjective: -Just woke up, feels a little tired but better  Assessment and Plan:  Paroxysmal atrial fibrillation, new onset - Rate controlled at this time, continue Coreg  - Cards following, on IV heparin , echo pending, switch to DOAC if no ischemic workup warranted  Acute CHF, unknown type - Could be triggered by new A-fib -Follow-up echo -Clinically remains volume overloaded, continue IV Lasix  today -Continue ARB, switch to Entresto soon, add Aldactone  - Monitor I's/O, BMP   Hypertension- -improving, meds as above   CKD 3A- -stable, monitor with diuresis  Hypokalemia Replete   Hyperlipidemia-continue home statin   Depression-continue duloxetine , mirtazapine    Chronic osteoarthritis-resume home pain medications   DVT prophylaxis: On heparin  infusion Code Status: Full code per patient, she is unsure and wishes to speak with her son later on Family Communication: No family at bedside Disposition Plan: Home pending above workup  Consultants:    Procedures:   Antimicrobials:    Objective: Vitals:   04/18/24 0441 04/18/24 0500 04/18/24 0841 04/18/24 0900  BP: (!) 144/58  (!) 151/88 (!) 151/88  Pulse: 65  66 83  Resp: 19  20   Temp: 98 F (36.7 C)  (!) 97.4 F (36.3 C)   TempSrc: Oral  Oral   SpO2: 92%  93%   Weight:  55.9 kg    Height:        Intake/Output Summary (Last 24 hours)  at 04/18/2024 1022 Last data filed at 04/17/2024 2050 Gross per 24 hour  Intake 240 ml  Output 1000 ml  Net -760 ml   Filed Weights   04/16/24 2341 04/17/24 1753 04/18/24 0500  Weight: 62.5 kg 56.4 kg 55.9 kg    Examination:  General exam: Appears calm and comfortable, AO x 3 HEENT: Positive JVD Respiratory system: Few basilar rales Cardiovascular system: S1 & S2 heard, irregular rhythm Abd: nondistended, soft and nontender.Normal bowel sounds heard. Central nervous system: Alert and oriented. No focal neurological deficits. Extremities: no edema Skin: No rashes Psychiatry:  Mood & affect appropriate.     Data Reviewed:   CBC: Recent Labs  Lab 04/16/24 1812 04/17/24 0507 04/18/24 0426  WBC 9.1 11.4* 9.4  HGB 12.9 13.8 12.1  HCT 37.9 39.5 35.6*  MCV 86.7 84.8 85.8  PLT 237 251 195   Basic Metabolic Panel: Recent Labs  Lab 04/16/24 1812 04/16/24 1920 04/17/24 1506 04/18/24 0426 04/18/24 0828  NA 139  --  137 138  --   K 3.0*  --  3.4* 2.8*  --   CL 98  --  99 96*  --   CO2 29  --  26 26  --   GLUCOSE 112*  --  87 100*  --   BUN 12  --  9 12  --   CREATININE 1.04*  --  1.03* 1.29*  --   CALCIUM 9.3  --  9.1 8.6*  --  MG  --  2.0  --   --  1.7   GFR: Estimated Creatinine Clearance: 24.5 mL/min (A) (by C-G formula based on SCr of 1.29 mg/dL (H)). Liver Function Tests: Recent Labs  Lab 04/17/24 1813  AST 22  ALT 10  ALKPHOS 83  BILITOT 1.2  PROT 6.9  ALBUMIN  3.6   No results for input(s): LIPASE, AMYLASE in the last 168 hours. No results for input(s): AMMONIA in the last 168 hours. Coagulation Profile: No results for input(s): INR, PROTIME in the last 168 hours. Cardiac Enzymes: No results for input(s): CKTOTAL, CKMB, CKMBINDEX, TROPONINI in the last 168 hours. BNP (last 3 results) Recent Labs    04/16/24 1920  PROBNP 11,800.0*   HbA1C: No results for input(s): HGBA1C in the last 72 hours. CBG: No results for  input(s): GLUCAP in the last 168 hours. Lipid Profile: Recent Labs    04/18/24 0426  CHOL 116  HDL 49  LDLCALC 55  TRIG 62  CHOLHDL 2.4   Thyroid  Function Tests: Recent Labs    04/17/24 1813  TSH 5.117*   Anemia Panel: No results for input(s): VITAMINB12, FOLATE, FERRITIN, TIBC, IRON, RETICCTPCT in the last 72 hours. Urine analysis:    Component Value Date/Time   COLORURINE YELLOW 08/22/2013 1502   APPEARANCEUR CLOUDY (A) 08/22/2013 1502   LABSPEC 1.015 08/22/2013 1502   PHURINE 6.0 08/22/2013 1502   GLUCOSEU NEGATIVE 08/22/2013 1502   HGBUR NEGATIVE 08/22/2013 1502   BILIRUBINUR NEGATIVE 08/22/2013 1502   KETONESUR NEGATIVE 08/22/2013 1502   PROTEINUR NEGATIVE 08/22/2013 1502   UROBILINOGEN 0.2 08/22/2013 1502   NITRITE NEGATIVE 08/22/2013 1502   LEUKOCYTESUR SMALL (A) 08/22/2013 1502   Sepsis Labs: @LABRCNTIP (procalcitonin:4,lacticidven:4)  ) Recent Results (from the past 240 hours)  MRSA Next Gen by PCR, Nasal     Status: None   Collection Time: 04/17/24  7:52 PM   Specimen: Nasal Mucosa; Nasal Swab  Result Value Ref Range Status   MRSA by PCR Next Gen NOT DETECTED NOT DETECTED Final    Comment: (NOTE) The GeneXpert MRSA Assay (FDA approved for NASAL specimens only), is one component of a comprehensive MRSA colonization surveillance program. It is not intended to diagnose MRSA infection nor to guide or monitor treatment for MRSA infections. Test performance is not FDA approved in patients less than 6 years old. Performed at [facility], [address], [place], [place] [address]      Radiology Studies: CT Angio Chest PE W and/or Wo Contrast Result Date: 04/16/2024 EXAM: CTA of the Chest with contrast for PE 04/16/2024 08:38:17 PM TECHNIQUE: CTA of the chest was performed after the administration of intravenous contrast. Multiplanar reformatted images are provided for review. MIP images are provided for review. Automated exposure  control, iterative reconstruction, and/or weight based adjustment of the mA/kV was utilized to reduce the radiation dose to as low as reasonably achievable. COMPARISON: Chest radiograph 04/16/2024 CLINICAL HISTORY: Pulmonary embolism (PE) suspected, low to intermediate prob, positive D-dimer. FINDINGS: LIMITATIONS/ARTIFACTS: Moderate motion artifact limits examination. PULMONARY ARTERIES: Pulmonary arteries are adequately opacified for evaluation. No pulmonary embolism. Main pulmonary artery is normal in caliber. MEDIASTINUM: Cardiac enlargement with particular right heart enlargement. Reflux of contrast material into the hepatic vein suggests right heart failure. No pericardial effusions. Calcification throughout the aorta and coronary arteries. LYMPH NODES: No mediastinal, hilar or axillary lymphadenopathy. LUNGS AND PLEURA: Large bilateral pleural effusions with basilar atelectasis. No pneumothorax. UPPER ABDOMEN: Limited images of the upper abdomen demonstrate reflux  of contrast material into the hepatic vein, suggesting right heart failure. SOFT TISSUES AND BONES: Degenerative changes in the spine. No acute bone or soft tissue abnormality. IMPRESSION: 1. No pulmonary embolism. 2. Large bilateral pleural effusions with basilar atelectasis. 3. Cardiomegaly with right heart enlargement and reflux of contrast into the hepatic veins, suggestive of right heart failure. Electronically signed by: Elsie Gravely MD 04/16/2024 08:44 PM EST RP Workstation: [id number]   DG Chest 2 View Result Date: 04/16/2024 CLINICAL DATA:  Chest pain, hypertension EXAM: CHEST - 2 VIEW COMPARISON:  09/25/2019 FINDINGS: Frontal and lateral views of the chest demonstrate an unremarkable cardiac silhouette. There is bibasilar consolidation with small bilateral pleural effusions, right greater than left. No pneumothorax. No acute bony abnormalities. Stable compression deformities at the thoracolumbar junction. IMPRESSION: 1. Bibasilar  consolidation, right greater than left, which may reflect airspace disease or atelectasis. 2. Small bilateral pleural effusions, right greater than left. Electronically Signed   By: Ozell Daring M.D.   On: 04/16/2024 19:28     Scheduled Meds:  carvedilol   6.25 mg Oral BID WC   DULoxetine   60 mg Oral Daily   furosemide   40 mg Intravenous Q12H   irbesartan   150 mg Oral Daily   mirtazapine   15 mg Oral QHS   potassium chloride   40 mEq Oral Q4H   pregabalin   50 mg Oral QHS   simvastatin   40 mg Oral q1800   sodium chloride  flush  3 mL Intravenous Q12H   spironolactone   25 mg Oral Daily   Continuous Infusions:  sodium chloride      heparin  900 Units/hr (04/17/24 1959)   magnesium  sulfate bolus IVPB 2 g (04/18/24 0957)     LOS: 1 day    Time spent:    Sigurd Pac, MD [facility]   04/18/2024, 10:22 AM

## 2024-04-18 NOTE — Progress Notes (Addendum)
 Progress Note  Patient Name: Pamela Robinson Date of Encounter: 04/18/2024 [facility] Cardiologist: Ozell Fell, MD   Interval Summary    Breathing much better this morning.   Vital Signs Vitals:   04/17/24 2340 04/18/24 0441 04/18/24 0500 04/18/24 0841  BP: (!) 158/88 (!) 144/58  (!) 151/88  Pulse: 80 65  66  Resp: 18 19  20   Temp: 97.7 F (36.5 C) 98 F (36.7 C)  (!) 97.4 F (36.3 C)  TempSrc: Oral Oral  Oral  SpO2: 95% 92%  93%  Weight:   55.9 kg   Height:        Intake/Output Summary (Last 24 hours) at 04/18/2024 0924 Last data filed at 04/17/2024 2050 Gross per 24 hour  Intake 240 ml  Output 1000 ml  Net -760 ml      04/18/2024    5:00 AM 04/17/2024    5:53 PM 04/16/2024   11:41 PM  Last 3 Weights  Weight (lbs) 123 lb 3.8 oz 124 lb 4.8 oz 137 lb 12.6 oz  Weight (kg) 55.9 kg 56.382 kg 62.5 kg     Cardiac Studies  Echocardiogram: EF 55 to 60%.  No RWMA.  Severe LVH.  Mildly elevated PAP.  Severe LA dilation.  Mild to moderate TR.  Normal RAP.  Mild aortic stenosis  Telemetry/ECG   Atrial fibrillation, rates 70-80s - Personally Reviewed  Physical Exam  GEN: No acute distress.   Neck: No JVD Cardiac: Irreg Irreg, 2/6 systolic murmur LUSB, no rubs, or gallops.  Respiratory: Diminished in bases  GI: Soft, nontender, non-distended  MS: No edema  Assessment & Plan   89 y.o. female with a hx of hypertension, mild aortic stenosis, hyperlipidemia, and osteoarthritis who was seen 04/17/2024 for the evaluation of atrial fibrillation at the request of Nilda Fendt MD.   New onset atrial fibrillation -- Presented to the ED after being referred by her PCP for an office visit for fatigue, lightheadedness dizziness and shortness of breath, patient was noted in new onset atrial fibrillation -- On IV heparin , plan to transition to DOAC once echocardiogram results noted  -- Increase carvedilol  to 6.25 mg twice daily  Suspected diastolic CHF --  Presents with increased shortness of breath, proBNP 11,800.  Chest x-ray with bibasilar consolidation likely reflective of atelectasis as well as small bilateral pleural effusions right greater than left -- on IV Lasix  40 mg daily, 1 L UOP. Breathing improved.  -- Echo pending - Stable EF on echo.  Severe LVH consistent with diastolic heart failure.  Hypertensive urgency -- Presented with systolic BP greater than 200, remains elevated this morning --Increase carvedilol  to 6.25 mg twice daily continue irbesartan  150 mg daily, spironolactone  25 mg daily  Aortic stenosis --Noted as mild on echocardiogram in 2023 --Repeat echo pending=> confirms mild stenosis.  Hyperlipidemia --LDL 55, HDL 49 --Continue Zocor  40 mg daily  Hypokalemia Hypomagnesemia -- supp K/Mg  For questions or updates, please contact  [facility] Please consult [email or web address] for contact info under   Signed, Manuelita Rummer, NP    ATTENDING ATTESTATION  I have seen, examined and evaluated the patient this AM on rounds along with Manuelita Rummer, NP-C .  After reviewing all the available data and chart, we discussed the patients laboratory, study & physical findings as well as symptoms in detail.  I agree with her findings, examination as well as impression recommendations as per our discussion.    Attending adjustments noted in italics.   Much  better heart rate control.  BP improving but we are increasing beta-blocker dose.  Diuresing relatively well.  Thankfully, her echocardiogram is relatively preserved.  For now continue to treat with rate control and anticoagulation.  Continue diuresis.    Alm MICAEL Clay, MD, MS Alm Clay, M.D., M.S. Interventional Cardiologist  [facility] Pager # [phone]

## 2024-04-18 NOTE — TOC Initial Note (Signed)
 Transition of Care [facility]) - Initial/Assessment Note    Patient Details  Name: Pamela Robinson MRN: [id number] Date of Birth: 1932/12/07  Transition of Care [facility]) CM/SW Contact:    Sudie Erminio Deems, RN Phone Number: 04/18/2024, 2:08 PM  Clinical Narrative:  Patient presented for shortness of breath. PTA patient was from home with son. Patient states she has DME: cane and rolling walker. Patient states son is in the home most of the day. Patient has PCP and son drives her to appointments. No home needs identified at this time. ICM will continue to follow for additional disposition needs as she progresses.                 Expected Discharge Plan: Home/Self Care Barriers to Discharge: Continued Medical Work up   Patient Goals and CMS Choice Patient states their goals for this hospitalization and ongoing recovery are:: Plans to return home once stable          Expected Discharge Plan and Services In-house Referral: NA Discharge Planning Services: CM Consult Post Acute Care Choice: NA Living arrangements for the past 2 months: Single Family Home                   DME Agency: NA       HH Arranged: NA          Prior Living Arrangements/Services Living arrangements for the past 2 months: Single Family Home Lives with:: Adult Children Patient language and need for interpreter reviewed:: Yes Do you feel safe going back to the place where you live?: Yes      Need for Family Participation in Patient Care: Yes (Comment) Care giver support system in place?: Yes (comment) Current home services: DME (cane, rolling walker,) Criminal Activity/Legal Involvement Pertinent to Current Situation/Hospitalization: No - Comment as needed  Activities of Daily Living   ADL Screening (condition at time of admission) Independently performs ADLs?: Yes (appropriate for developmental age) Is the patient deaf or have difficulty hearing?: No Does the patient have difficulty seeing,  even when wearing glasses/contacts?: No Does the patient have difficulty concentrating, remembering, or making decisions?: No  Permission Sought/Granted Permission sought to share information with : Family Supports, Case Manager                Emotional Assessment Appearance:: Appears stated age Attitude/Demeanor/Rapport: Engaged Affect (typically observed): Appropriate Orientation: : Oriented to Self, Oriented to Place, Oriented to  Time, Oriented to Situation Alcohol / Substance Use: Not Applicable Psych Involvement: No (comment)  Admission diagnosis:  Acute CHF (congestive heart failure) (HCC) [I50.9] Atrial fibrillation, unspecified type (HCC) [I48.91] Acute congestive heart failure, unspecified heart failure type (HCC) [I50.9] Patient Active Problem List   Diagnosis Date Noted   Acute CHF (congestive heart failure) (HCC) 04/16/2024   History of basal cell carcinoma (BCC) 12/14/2023   Basal cell carcinoma (BCC) of face 12/14/2023   Basal cell carcinoma (BCC) of cheek 12/14/2023   Squamous cell carcinoma of back 12/14/2023   Drug-induced constipation 10/19/2023   Therapeutic opioid induced constipation 06/21/2023   Restless legs 11/16/2021   Lumbar spondylosis 03/17/2021   Lumbar radiculopathy 12/16/2020   Spinal stenosis of lumbar region 12/16/2020   Lumbar post-laminectomy syndrome 11/06/2020   Degeneration of lumbar intervertebral disc 11/06/2020   Pain of lumbar spine 07/22/2020   PCP:  Vernon Velna SAUNDERS, MD Pharmacy:   [id number] - [place] ([place]), [place] - [address] [place] ([place]) [place]  [address] Phone: [phone] Fax: [phone]     Social Drivers of Health (SDOH) Social History: SDOH Screenings   Food Insecurity: No Food Insecurity (04/17/2024)  Housing: Low Risk  (04/17/2024)  Transportation Needs: No Transportation Needs (04/17/2024)  Utilities: Not At Risk (04/17/2024)  Social Connections: Unknown (04/17/2024)   Tobacco Use: Low Risk  (04/16/2024)   SDOH Interventions:     Readmission Risk Interventions     No data to display

## 2024-04-18 NOTE — Progress Notes (Signed)
 Echocardiogram 2D Echocardiogram has been performed.  Juliene JINNY Rucks 04/18/2024, 10:49 AM

## 2024-04-18 NOTE — Telephone Encounter (Addendum)
 Pharmacy Patient Advocate Encounter  Insurance verification completed.    The patient is insured through [employer] [facility]. Patient has Medicare and is not eligible for a copay card, but may be able to apply for patient assistance or Medicare RX Payment Plan (Patient Must reach out to their plan, if eligible for payment plan), if available.    Ran test claim for Eliquis  5mg  and the current 30 day co-pay is $47.  Ran test claim for Xarelto 20mg  and the current 30 day co-pay is $47.  This test claim was processed through [employer]- copay amounts may vary at other pharmacies due to [employer], or as the patient moves through the different stages of their insurance plan.

## 2024-04-19 ENCOUNTER — Inpatient Hospital Stay (HOSPITAL_COMMUNITY)

## 2024-04-19 DIAGNOSIS — I5031 Acute diastolic (congestive) heart failure: Secondary | ICD-10-CM | POA: Diagnosis not present

## 2024-04-19 DIAGNOSIS — I509 Heart failure, unspecified: Secondary | ICD-10-CM | POA: Diagnosis not present

## 2024-04-19 DIAGNOSIS — I10 Essential (primary) hypertension: Secondary | ICD-10-CM | POA: Diagnosis not present

## 2024-04-19 DIAGNOSIS — J90 Pleural effusion, not elsewhere classified: Secondary | ICD-10-CM | POA: Diagnosis not present

## 2024-04-19 DIAGNOSIS — I4891 Unspecified atrial fibrillation: Secondary | ICD-10-CM | POA: Diagnosis not present

## 2024-04-19 LAB — BASIC METABOLIC PANEL WITH GFR
Anion gap: 10 (ref 5–15)
BUN: 17 mg/dL (ref 8–23)
CO2: 29 mmol/L (ref 22–32)
Calcium: 8.9 mg/dL (ref 8.9–10.3)
Chloride: 98 mmol/L (ref 98–111)
Creatinine, Ser: 1.48 mg/dL — ABNORMAL HIGH (ref 0.44–1.00)
GFR, Estimated: 34 mL/min — ABNORMAL LOW (ref >60)
Glucose, Bld: 102 mg/dL — ABNORMAL HIGH (ref 70–99)
Potassium: 4.8 mmol/L (ref 3.5–5.1)
Sodium: 137 mmol/L (ref 135–145)

## 2024-04-19 LAB — CBC
HCT: 38.9 % (ref 36.0–46.0)
Hemoglobin: 12.6 g/dL (ref 12.0–15.0)
MCH: 28.9 pg (ref 26.0–34.0)
MCHC: 32.4 g/dL (ref 30.0–36.0)
MCV: 89.2 fL (ref 80.0–100.0)
Platelets: 223 K/uL (ref 150–400)
RBC: 4.36 MIL/uL (ref 3.87–5.11)
RDW: 14.7 % (ref 11.5–15.5)
WBC: 11.9 K/uL — ABNORMAL HIGH (ref 4.0–10.5)
nRBC: 0 % (ref 0.0–0.2)

## 2024-04-19 LAB — HEPARIN LEVEL (UNFRACTIONATED): Heparin Unfractionated: 0.7 [IU]/mL (ref 0.30–0.70)

## 2024-04-19 MED ORDER — HYDRALAZINE HCL 20 MG/ML IJ SOLN: 10.0000 mg | Freq: Four times a day (QID) | INTRAMUSCULAR | Status: DC | PRN

## 2024-04-19 MED ORDER — FUROSEMIDE 10 MG/ML IJ SOLN
40.0000 mg | Freq: Once | INTRAMUSCULAR | Status: AC
  Administered 2024-04-19: 40 mg via INTRAVENOUS
  Filled 2024-04-19: qty 4

## 2024-04-19 MED ORDER — APIXABAN 2.5 MG PO TABS
2.5000 mg | ORAL_TABLET | Freq: Two times a day (BID) | ORAL | Status: DC
  Administered 2024-04-19 – 2024-04-21 (×5): 2.5 mg via ORAL
  Filled 2024-04-19 (×5): qty 1

## 2024-04-19 MED ORDER — ALBUMIN HUMAN 25 % IV SOLN
25.0000 g | Freq: Once | INTRAVENOUS | Status: AC
  Administered 2024-04-19: 25 g via INTRAVENOUS
  Filled 2024-04-19: qty 100

## 2024-04-19 NOTE — Progress Notes (Signed)
 Progress Note  Patient Name: Pamela Robinson Date of Encounter: 04/19/2024 [facility] Cardiologist: Ozell Fell, MD   Interval Summary    I/O's not recorded yesterday.  Rising creatinine (1.03 > 1.29 > 1.48).  BP 147/89 today.  Denies any chest pain.  Reports dyspnea improved.    Vital Signs Vitals:   04/18/24 1659 04/18/24 2110 04/18/24 2335 04/19/24 0408  BP: (!) 146/94 (!) 155/94 (!) 142/79 (!) 147/89  Pulse: 76 83 71 69  Resp:  18 17 18   Temp:  98.7 F (37.1 C) 97.8 F (36.6 C) (!) 97.5 F (36.4 C)  TempSrc:  Oral Oral Oral  SpO2:  98% 96% 98%  Weight:    58.1 kg  Height:        Intake/Output Summary (Last 24 hours) at 04/19/2024 0706 Last data filed at 04/19/2024 0350 Gross per 24 hour  Intake 598.79 ml  Output --  Net 598.79 ml      04/19/2024    4:08 AM 04/18/2024    5:00 AM 04/17/2024    5:53 PM  Last 3 Weights  Weight (lbs) 128 lb 123 lb 3.8 oz 124 lb 4.8 oz  Weight (kg) 58.06 kg 55.9 kg 56.382 kg     Cardiac Studies  Echocardiogram: EF 55 to 60%.  No RWMA.  Severe LVH.  Mildly elevated PAP.  Severe LA dilation.  Mild to moderate TR.  Normal RAP.  Mild aortic stenosis  Telemetry/ECG   Atrial fibrillation, rates 70-80s - Personally Reviewed  Physical Exam  GEN: No acute distress.   Neck: No JVD Cardiac: Irreg Irreg, 2/6 systolic murmur LUSB, no rubs, or gallops.  Respiratory: Diminished in bases  GI: Soft, nontender, non-distended  MS: No edema  Assessment & Plan   89 y.o. female with a hx of hypertension, mild aortic stenosis, hyperlipidemia, and osteoarthritis who was seen 04/17/2024 for the evaluation of atrial fibrillation at the request of Nilda Fendt MD.   New onset atrial fibrillation -- Presented to the ED after being referred by her PCP for an office visit for fatigue, lightheadedness dizziness and shortness of breath, patient was noted in new onset atrial fibrillation -- On IV heparin , will transition to p.o.  Eliquis  today -- Continue carvedilol  6.25 mg twice daily.  Rates appear well-controlled -- Given rates appear well-controlled and normal EF on echocardiogram, would plan outpatient cardioversion after she completes 3 weeks of anticoagulation  Acute diastolic CHF -- Presents with increased shortness of breath, proBNP 11,800.  CT showed large bilateral pleural effusions -- Echocardiogram showed EF 55 to 60%, severe LVH, mildly reduced RV function, mildly elevated pulmonary pressures, severe left atrial enlargement, mild to moderate tricuspid regurgitation -- Has been diuresed with IV Lasix .  Her creatinine is rising and she appears euvolemic on exam.  IVC small/collapsible on echo.  Would hold lasix  for today.  Had large pleural effusions on CT, recommend checking chest x-ray.  If remains with significant effusions, would plan thoracentesis  Hypertensive urgency -- Presented with systolic BP greater than 200, has improved, 140/80s this morning -- Continue carvedilol   6.25 mg twice daily, irbesartan  150 mg daily, spironolactone  25 mg daily  Aortic stenosis --Noted as mild on echocardiogram in 2023 -- Echo this admission shows mild stenosis  Hyperlipidemia --LDL 55, HDL 49 --Continue Zocor  40 mg daily  Hypokalemia Hypomagnesemia -- supp K/Mg  For questions or updates, please contact [facility] Please consult [email or web address] for contact info under   Signed, Lonni LITTIE Nanas, MD

## 2024-04-19 NOTE — Progress Notes (Addendum)
 PROGRESS NOTE    Pamela Robinson  FMW:[id number] DOB: [birth year] DOA: 04/16/2024 PCP: Vernon Velna SAUNDERS, MD   89/F w history of hypertension, hyperlipidemia, osteoarthritis, presented to the ED with worsening dyspnea for 3 to 4 days .  Went to see her PCP, noted to be in A-fib, sent to the ED .  In the ED noted to be hypertensive with rate controlled A-fib,, labs noted D-dimer 3.3, proBNP 11.8 K, troponin negative, CTA chest noted no PE, large bilateral effusions with atelectasis, concern for right heart failure - Admitted, started on diuretics, echo pending, cards following  Subjective: -Just woke up, feels a little tired but better  Assessment and Plan:  Paroxysmal atrial fibrillation, new onset - Rate controlled at this time, continue Coreg  - Cards following, switch to oral Eliquis  today  Acute diastolic CHF - Could be triggered by new A-fib - 2D echo noted EF 55-60%, severe LVH, mildly reduced RV - Repeat x-ray with bilateral effusions just not large enough for thoracentesis unfortunately -Uptrending creatinine will discontinue ARB, continue Aldactone  -Will discuss with cards, Lasix  x1 this evening - Switch to oral diuretics tomorrow, attempt to wean off O2 -Increase activity, PT eval   Hypertension- -improving, meds as above   AKI on CKD 3A- -discontinue ARB, see discussion above  Hypokalemia Replete   Hyperlipidemia-continue home statin   Depression-continue duloxetine , mirtazapine    Chronic osteoarthritis-resume home pain medications   DVT prophylaxis: Eliquis  Code Status: Full code Family Communication: No family at bedside Disposition Plan: Home pending above workup  Consultants:    Procedures:   Antimicrobials:    Objective: Vitals:   04/18/24 2335 04/19/24 0408 04/19/24 0757 04/19/24 1056  BP: (!) 142/79 (!) 147/89    Pulse: 71 69    Resp: 17 18 18 19   Temp: 97.8 F (36.6 C) (!) 97.5 F (36.4 C) 98 F (36.7 C) 98.1 F (36.7 C)  TempSrc:  Oral Oral Oral Oral  SpO2: 96% 98%    Weight:  58.1 kg    Height:        Intake/Output Summary (Last 24 hours) at 04/19/2024 1059 Last data filed at 04/19/2024 0836 Gross per 24 hour  Intake 718.79 ml  Output 400 ml  Net 318.79 ml   Filed Weights   04/17/24 1753 04/18/24 0500 04/19/24 0408  Weight: 56.4 kg 55.9 kg 58.1 kg    Examination:  General exam: Appears calm and comfortable, AO x 3 HEENT: Positive JVD Respiratory system: Decreased breath sounds in the bases, few basilar Rales Cardiovascular system: S1 & S2 heard, irregular rhythm Abd: nondistended, soft and nontender.Normal bowel sounds heard. Central nervous system: Alert and oriented. No focal neurological deficits. Extremities: no edema Skin: No rashes Psychiatry:  Mood & affect appropriate.     Data Reviewed:   CBC: Recent Labs  Lab 04/16/24 1812 04/17/24 0507 04/18/24 0426 04/19/24 0333  WBC 9.1 11.4* 9.4 11.9*  HGB 12.9 13.8 12.1 12.6  HCT 37.9 39.5 35.6* 38.9  MCV 86.7 84.8 85.8 89.2  PLT 237 251 195 223   Basic Metabolic Panel: Recent Labs  Lab 04/16/24 1812 04/16/24 1920 04/17/24 1506 04/18/24 0426 04/18/24 0828 04/19/24 0333  NA 139  --  137 138  --  137  K 3.0*  --  3.4* 2.8*  --  4.8  CL 98  --  99 96*  --  98  CO2 29  --  26 26  --  29  GLUCOSE 112*  --  87 100*  --  102*  BUN 12  --  9 12  --  17  CREATININE 1.04*  --  1.03* 1.29*  --  1.48*  CALCIUM 9.3  --  9.1 8.6*  --  8.9  MG  --  2.0  --   --  1.7  --    GFR: Estimated Creatinine Clearance: 21.3 mL/min (A) (by C-G formula based on SCr of 1.48 mg/dL (H)). Liver Function Tests: Recent Labs  Lab 04/17/24 1813  AST 22  ALT 10  ALKPHOS 83  BILITOT 1.2  PROT 6.9  ALBUMIN  3.6   No results for input(s): LIPASE, AMYLASE in the last 168 hours. No results for input(s): AMMONIA in the last 168 hours. Coagulation Profile: No results for input(s): INR, PROTIME in the last 168 hours. Cardiac Enzymes: No results  for input(s): CKTOTAL, CKMB, CKMBINDEX, TROPONINI in the last 168 hours. BNP (last 3 results) Recent Labs    04/16/24 1920  PROBNP 11,800.0*   HbA1C: No results for input(s): HGBA1C in the last 72 hours. CBG: No results for input(s): GLUCAP in the last 168 hours. Lipid Profile: Recent Labs    04/18/24 0426  CHOL 116  HDL 49  LDLCALC 55  TRIG 62  CHOLHDL 2.4   Thyroid  Function Tests: Recent Labs    04/17/24 1813  TSH 5.117*   Anemia Panel: No results for input(s): VITAMINB12, FOLATE, FERRITIN, TIBC, IRON, RETICCTPCT in the last 72 hours. Urine analysis:    Component Value Date/Time   COLORURINE YELLOW 08/22/2013 1502   APPEARANCEUR CLOUDY (A) 08/22/2013 1502   LABSPEC 1.015 08/22/2013 1502   PHURINE 6.0 08/22/2013 1502   GLUCOSEU NEGATIVE 08/22/2013 1502   HGBUR NEGATIVE 08/22/2013 1502   BILIRUBINUR NEGATIVE 08/22/2013 1502   KETONESUR NEGATIVE 08/22/2013 1502   PROTEINUR NEGATIVE 08/22/2013 1502   UROBILINOGEN 0.2 08/22/2013 1502   NITRITE NEGATIVE 08/22/2013 1502   LEUKOCYTESUR SMALL (A) 08/22/2013 1502   Sepsis Labs: @LABRCNTIP (procalcitonin:4,lacticidven:4)  ) Recent Results (from the past 240 hours)  MRSA Next Gen by PCR, Nasal     Status: None   Collection Time: 04/17/24  7:52 PM   Specimen: Nasal Mucosa; Nasal Swab  Result Value Ref Range Status   MRSA by PCR Next Gen NOT DETECTED NOT DETECTED Final    Comment: (NOTE) The GeneXpert MRSA Assay (FDA approved for NASAL specimens only), is one component of a comprehensive MRSA colonization surveillance program. It is not intended to diagnose MRSA infection nor to guide or monitor treatment for MRSA infections. Test performance is not FDA approved in patients less than 42 years old. Performed at [facility], [address] [place], [place] [address]      Radiology Studies: DG CHEST PORT 1 VIEW Result Date: 04/19/2024 EXAM: 1 VIEW(S) XRAY OF THE CHEST 04/19/2024  07:47:00 AM COMPARISON: CTA chest 04/16/2024. CLINICAL HISTORY: Pleural effusion. FINDINGS: LUNGS AND PLEURA: There are persisting small to moderate pleural effusions, slightly greater fluid on the left, with overlying lung opacities in the bases consistent with atelectasis or consolidation. The remaining lung fields are clear. No pneumothorax. Overall aeration seems unchanged. HEART AND MEDIASTINUM: There is stable cardiomegaly without evidence for CHF. There is a tortuous and calcified aorta with a stable mediastinum. BONES AND SOFT TISSUES: There is partially visible dorsal fusion hardware in the lower cervical spine. No new abnormality. IMPRESSION: 1. Persisting small to moderate pleural effusions, slightly greater on the left, with overlying basilar opacities consistent with atelectasis or consolidation. 2. Stable cardiomegaly without  evidence for CHF. Electronically signed by: Francis Quam MD 04/19/2024 07:55 AM EST RP Workstation: [id number]   ECHOCARDIOGRAM COMPLETE Result Date: 04/18/2024    ECHOCARDIOGRAM REPORT   Patient Name:   YOLUNDA KLOOS [facility] Date of Exam: 04/18/2024 Medical Rec #:  [id number]          Height:       63.0 in Accession #:    [id number]         Weight:       123.2 lb Date of Birth:  1932/09/22         BSA:          1.574 m Patient Age:    89 years           BP:           151/88 mmHg Patient Gender: F                  HR:           79 bpm. Exam Location:  Inpatient Procedure: 2D Echo, Cardiac Doppler and Color Doppler (Both Spectral and Color            Flow Doppler were utilized during procedure). Indications:    CHF - Acute Systolic  History:        Patient has prior history of Echocardiogram examinations, most                 recent 10/13/2021. CHF, Signs/Symptoms:Fatigue and Murmur; Risk                 Factors:Hypertension and Dyslipidemia.  Sonographer:    Juliene Rucks Referring Phys: [phone] NILDA HERO GHERGHE IMPRESSIONS  1. Left ventricular ejection fraction, by estimation, is 55  to 60%. The left ventricle has normal function. The left ventricle has no regional wall motion abnormalities. There is severe left ventricular hypertrophy. Left ventricular diastolic parameters  are indeterminate.  2. Right ventricular systolic function is mildly reduced. The right ventricular size is normal. There is mildly elevated pulmonary artery systolic pressure. The estimated right ventricular systolic pressure is 42.4 mmHg.  3. Left atrial size was severely dilated.  4. The mitral valve is degenerative. Trivial mitral valve regurgitation.  5. Tricuspid valve regurgitation is mild to moderate.  6. The aortic valve was not well visualized. Aortic valve regurgitation is not visualized. Mild aortic valve stenosis.  7. The inferior vena cava is normal in size with greater than 50% respiratory variability, suggesting right atrial pressure of 3 mmHg. FINDINGS  Left Ventricle: Left ventricular ejection fraction, by estimation, is 55 to 60%. The left ventricle has normal function. The left ventricle has no regional wall motion abnormalities. The left ventricular internal cavity size was small. There is severe left ventricular hypertrophy. Left ventricular diastolic parameters are indeterminate. Right Ventricle: The right ventricular size is normal. No increase in right ventricular wall thickness. Right ventricular systolic function is mildly reduced. There is mildly elevated pulmonary artery systolic pressure. The tricuspid regurgitant velocity  is 3.14 m/s, and with an assumed right atrial pressure of 3 mmHg, the estimated right ventricular systolic pressure is 42.4 mmHg. Left Atrium: Left atrial size was severely dilated. Right Atrium: Right atrial size was normal in size. Pericardium: There is no evidence of pericardial effusion. Mitral Valve: The mitral valve is degenerative in appearance. Trivial mitral valve regurgitation. Tricuspid Valve: The tricuspid valve is normal in structure. Tricuspid valve  regurgitation is mild to moderate. Aortic Valve: The aortic valve was not  well visualized. Aortic valve regurgitation is not visualized. Mild aortic stenosis is present. Pulmonic Valve: The pulmonic valve was grossly normal. Pulmonic valve regurgitation is trivial. Aorta: The aortic root is normal in size and structure. Venous: The inferior vena cava is normal in size with greater than 50% respiratory variability, suggesting right atrial pressure of 3 mmHg. IAS/Shunts: The atrial septum is grossly normal.  LEFT VENTRICLE PLAX 2D LVIDd:         3.90 cm   Diastology LVIDs:         2.60 cm   LV e' medial:    5.44 cm/s LV PW:         1.20 cm   LV E/e' medial:  18.8 LV IVS:        1.10 cm   LV e' lateral:   6.96 cm/s LVOT diam:     1.80 cm   LV E/e' lateral: 14.7 LV SV:         58 LV SV Index:   37 LVOT Area:     2.54 cm  RIGHT VENTRICLE          IVC RV Basal diam:  2.60 cm  IVC diam: 1.00 cm LEFT ATRIUM              Index        RIGHT ATRIUM           Index LA diam:        4.20 cm  2.67 cm/m   RA Area:     18.00 cm LA Vol (A2C):   56.5 ml  35.90 ml/m  RA Volume:   45.40 ml  28.84 ml/m LA Vol (A4C):   102.0 ml 64.80 ml/m LA Biplane Vol: 80.6 ml  51.21 ml/m  AORTIC VALVE LVOT Vmax:   147.00 cm/s LVOT Vmean:  91.300 cm/s LVOT VTI:    0.226 m  AORTA Ao Root diam: 2.80 cm MITRAL VALVE                TRICUSPID VALVE MV Area (PHT): 4.21 cm     TR Peak grad:   39.4 mmHg MV Decel Time: 180 msec     TR Vmax:        314.00 cm/s MR Peak grad: 64.3 mmHg MR Vmax:      401.00 cm/s   SHUNTS MV E velocity: 102.00 cm/s  Systemic VTI:  0.23 m                             Systemic Diam: 1.80 cm Lonni Nanas MD Electronically signed by Lonni Nanas MD Signature Date/Time: 04/18/2024/2:34:45 PM    Final      Scheduled Meds:  apixaban   2.5 mg Oral BID   carvedilol   6.25 mg Oral BID WC   DULoxetine   60 mg Oral Daily   furosemide   40 mg Intravenous Once   mirtazapine   15 mg Oral QHS   pregabalin   50 mg Oral QHS    simvastatin   40 mg Oral q1800   sodium chloride  flush  3 mL Intravenous Q12H   spironolactone   25 mg Oral Daily   Continuous Infusions:  albumin  human       LOS: 2 days    Time spent:    Sigurd Pac, MD [facility]   04/19/2024, 10:59 AM

## 2024-04-19 NOTE — Progress Notes (Addendum)
 ANTICOAGULATION CONSULT NOTE  Pharmacy Consult for Heparin  >> apixaban  Indication: atrial fibrillation  Patient Measurements: Height: 5' 3 (160 cm) Weight: 58.1 kg (128 lb) (pt requested to weigh later) IBW/kg (Calculated) : 52.4 Heparin  Dosing Weight: 62 kg  Vital Signs: Temp: 97.5 F (36.4 C) (11/27 0408) Temp Source: Oral (11/27 0408) BP: 147/89 (11/27 0408) Pulse Rate: 69 (11/27 0408)  Labs: Recent Labs    04/17/24 0507 04/17/24 1138 04/17/24 1506 04/18/24 0426 04/19/24 0333  HGB 13.8  --   --  12.1 12.6  HCT 39.5  --   --  35.6* 38.9  PLT 251  --   --  195 223  HEPARINUNFRC 0.54 0.59  --  0.59 0.70  CREATININE  --   --  1.03* 1.29* 1.48*    Estimated Creatinine Clearance: 21.3 mL/min (A) (by C-G formula based on SCr of 1.48 mg/dL (H)).   Medications:  Infusions:   heparin  900 Units/hr (04/19/24 0350)   Assessment: 49 yof with a history of HTN. Patient is presenting with SOB. Noted to have new onset AF at PCP office today. No evidence of PE on CTA. Patient is not on anticoagulation prior to arrival. CBC is stable. Pharmacy consulted to transition patient from heparin  to apixaban .   Patient meets criteria for reduced dosing for afib given age (>80 years) and weight (58.1kg). Scr 1.48  Goal of Therapy:  Monitor platelets by anticoagulation protocol: Yes   Plan:  Stop heparin  infusion  Start apixaban  2.5mg  PO BID   Kathe Wirick, PharmD PGY1 Clinical Pharmacist [facility]  04/19/2024 7:12 AM

## 2024-04-19 NOTE — Plan of Care (Signed)

## 2024-04-20 ENCOUNTER — Telehealth (HOSPITAL_COMMUNITY): Payer: Self-pay | Admitting: Pharmacy Technician

## 2024-04-20 ENCOUNTER — Other Ambulatory Visit (HOSPITAL_COMMUNITY): Payer: Self-pay

## 2024-04-20 DIAGNOSIS — I5033 Acute on chronic diastolic (congestive) heart failure: Secondary | ICD-10-CM | POA: Diagnosis not present

## 2024-04-20 DIAGNOSIS — I10 Essential (primary) hypertension: Secondary | ICD-10-CM

## 2024-04-20 DIAGNOSIS — I4819 Other persistent atrial fibrillation: Secondary | ICD-10-CM

## 2024-04-20 DIAGNOSIS — J90 Pleural effusion, not elsewhere classified: Secondary | ICD-10-CM

## 2024-04-20 LAB — GLUCOSE, CAPILLARY
Glucose-Capillary: 272 mg/dL — ABNORMAL HIGH (ref 70–99)
Glucose-Capillary: 95 mg/dL (ref 70–99)
Glucose-Capillary: 107 mg/dL — ABNORMAL HIGH (ref 70–99)
Glucose-Capillary: 107 mg/dL — ABNORMAL HIGH (ref 70–99)
Glucose-Capillary: 158 mg/dL — ABNORMAL HIGH (ref 70–99)

## 2024-04-20 LAB — BASIC METABOLIC PANEL WITH GFR
Anion gap: 13 (ref 5–15)
BUN: 18 mg/dL (ref 8–23)
CO2: 26 mmol/L (ref 22–32)
Calcium: 9 mg/dL (ref 8.9–10.3)
Chloride: 98 mmol/L (ref 98–111)
Creatinine, Ser: 1.21 mg/dL — ABNORMAL HIGH (ref 0.44–1.00)
GFR, Estimated: 43 mL/min — ABNORMAL LOW (ref >60)
Glucose, Bld: 99 mg/dL (ref 70–99)
Potassium: 4 mmol/L (ref 3.5–5.1)
Sodium: 137 mmol/L (ref 135–145)

## 2024-04-20 MED ORDER — AMLODIPINE BESYLATE 5 MG PO TABS
5.0000 mg | ORAL_TABLET | Freq: Every day | ORAL | Status: DC
  Administered 2024-04-20 – 2024-04-21 (×2): 5 mg via ORAL
  Filled 2024-04-20 (×2): qty 1

## 2024-04-20 MED ORDER — INSULIN ASPART 100 UNIT/ML IJ SOLN
0.0000 [IU] | Freq: Three times a day (TID) | INTRAMUSCULAR | Status: DC
  Administered 2024-04-21: 2 [IU] via SUBCUTANEOUS
  Filled 2024-04-20: qty 2

## 2024-04-20 MED ORDER — FUROSEMIDE 20 MG PO TABS
20.0000 mg | ORAL_TABLET | Freq: Every day | ORAL | Status: DC
  Administered 2024-04-20 – 2024-04-21 (×2): 20 mg via ORAL
  Filled 2024-04-20 (×2): qty 1

## 2024-04-20 MED ORDER — FUROSEMIDE 10 MG/ML IJ SOLN: 20.0000 mg | Freq: Once | INTRAMUSCULAR | Status: DC

## 2024-04-20 NOTE — Care Management Important Message (Signed)
 Important Message  Patient Details  Name: Arneta Mahmood MRN: [id number] Date of Birth: 07-Mar-1933   Important Message Given:  Yes - Medicare IM     Vonzell Arrie Sharps 04/20/2024, 11:49 AM

## 2024-04-20 NOTE — Plan of Care (Signed)

## 2024-04-20 NOTE — Telephone Encounter (Signed)
 Patient Product/process Development Scientist completed.    The patient is insured through [facility]. Patient has Medicare and is not eligible for a copay card, but may be able to apply for patient assistance or Medicare RX Payment Plan (Patient Must reach out to their plan, if eligible for payment plan), if available.    Ran test claim for Farxiga 10 mg and the current 30 day co-pay is $47.00.  Ran test claim for Jardiance 10 mg and the current 30 day co-pay is $47.00.  This test claim was processed through [facility]- copay amounts may vary at other pharmacies due to pharmacy/plan contracts, or as the patient moves through the different stages of their insurance plan.     Reyes Sharps, CPHT Pharmacy Technician Patient Advocate Specialist Lead [facility] Patient Advocate Team Direct Number: [phone]  Fax: [phone]

## 2024-04-20 NOTE — Progress Notes (Addendum)
 Progress Note  Patient Name: Pamela Robinson Date of Encounter: 04/20/2024 [facility] Cardiologist: Ozell Fell, MD   Interval Summary    Feels well this morning. No complaints, hopeful to DC home.   Vital Signs Vitals:   04/19/24 1933 04/19/24 2333 04/20/24 0334 04/20/24 0800  BP: 132/71   (!) 162/83  Pulse:      Resp: 18 17 17 20   Temp: (!) 97.5 F (36.4 C) 97.9 F (36.6 C) 97.6 F (36.4 C) 98.6 F (37 C)  TempSrc: Oral Oral Oral Oral  SpO2:      Weight:      Height:        Intake/Output Summary (Last 24 hours) at 04/20/2024 0831 Last data filed at 04/20/2024 0500 Gross per 24 hour  Intake 480 ml  Output 1550 ml  Net -1070 ml      04/19/2024    4:08 AM 04/18/2024    5:00 AM 04/17/2024    5:53 PM  Last 3 Weights  Weight (lbs) 128 lb 123 lb 3.8 oz 124 lb 4.8 oz  Weight (kg) 58.06 kg 55.9 kg 56.382 kg      Telemetry/ECG  Atrial fibrillation, rates 70s - Personally Reviewed  Physical Exam  GEN: No acute distress.   Neck: No JVD Cardiac: Irreg Irreg, 2/6 systolic murmur LUSB, no rubs, or gallops.  Respiratory: Clear to auscultation bilaterally. GI: Soft, nontender, non-distended  MS: No edema  Assessment & Plan   89 y.o. female with a hx of hypertension, mild aortic stenosis, hyperlipidemia, and osteoarthritis who was seen 04/17/2024 for the evaluation of atrial fibrillation at the request of Nilda Fendt MD.    New onset atrial fibrillation -- Presented to the ED after being referred by her PCP for an office visit for fatigue, lightheadedness dizziness and shortness of breath, patient was noted in new onset atrial fibrillation -- transitioned to Eliquis  2.5mg  BID yesterday -- Continue carvedilol  6.25 mg twice daily.  Rates appear well-controlled -- Given rates appear well-controlled and normal EF on echocardiogram, would plan outpatient cardioversion after she completes 3 weeks of anticoagulation => can be arranged at initial  outpatient follow-up.   Acute diastolic CHF -- Presents with increased shortness of breath, proBNP 11,800.  CT showed large bilateral pleural effusions -- Echocardiogram showed EF 55 to 60%, severe LVH, mildly reduced RV function, mildly elevated pulmonary pressures, severe left atrial enlargement, mild to moderate tricuspid regurgitation -- Has been diuresed with IV Lasix .  Had large pleural effusions on CT. CXR 11/27 persistent small to moderate pleural effusion. S/p IV lasix  40mg  x1, 1.9L UOP overnight. Net - 4.2 -- likely lasix  20mg  PO daily at discharge => with the option to take an additional dose for worsening dyspnea, edema, PND orthopnea, weight gain more than 3 pounds in 1 day.   Hypertensive urgency -- Presented with systolic BP greater than 200, has improved but still elevated  -- Continue carvedilol   6.25 mg twice daily, spironolactone  25 mg daily. ARB held yesterday with elevated Cr. Add amlodipine  5mg  daily => creatinine is improved.  For now agree with holding off on ARB until we see where we stabilizes out at -> can be titrated up in the outpatient setting..   Aortic stenosis -- Noted as mild on echocardiogram in 2023 -- Echo this admission shows mild stenosis   Hyperlipidemia --LDL 55, HDL 49 --Continue Zocor  40 mg daily   Hypokalemia Hypomagnesemia -- resolved   For questions or updates, please contact [facility]  Please consult [email or web address] for contact info under   Signed, Manuelita Rummer, NP    ATTENDING ATTESTATION  I have seen, examined and evaluated the patient this morning on rounds along with Manuelita Rummer, NP.  After reviewing all the available data and chart, we discussed the patients laboratory, study & physical findings as well as symptoms in detail.  I agree with her findings, examination as well as impression recommendations as per our discussion.    Attending adjustments noted in italics.    I think she is nearing being able to be  discharged, although we like to see how she responds to the amlodipine  that was started today.  If pressures continue be elevated, we can potentially titrate up to 10 mg amlodipine  either prior to discharge or in the outpatient setting.  Agree with plan for standing dose of diuretic in the outpatient setting as noted above.      Alm MICAEL Clay, MD, MS Alm Clay, M.D., M.S. Interventional Cardiologist  [facility] Pager # [phone]

## 2024-04-20 NOTE — Evaluation (Addendum)
 Physical Therapy Evaluation Patient Details Name: Pamela Robinson MRN: [id number] DOB: [birth year] Today's Date: 04/20/2024  History of Present Illness  89/F admitted 11/24 with A-fib at primary care office and send to ED.  In ED< pt  hypertensive with rate controlled A-fib. CTA chest noted no PE, large bilateral effusions with atelectasis, concern for right heart failure.  PMH: hypertension, hyperlipidemia, osteoarthritis  Clinical Impression  Pt admitted with above diagnosis. Pt able to ambulate needing min assist for safety. Difficult to determine if this is pts' baseline with ambulation. Will benefit from HHPT and has available assist at d/c by son.   Pt currently with functional limitations due to the deficits listed below (see PT Problem List). Pt will benefit from acute skilled PT to increase their independence and safety with mobility to allow discharge.           If plan is discharge home, recommend the following: A little help with walking and/or transfers;A little help with bathing/dressing/bathroom;Assistance with cooking/housework;Assist for transportation;Help with stairs or ramp for entrance   Can travel by private vehicle        Equipment Recommendations None recommended by PT  Recommendations for Other Services       Functional Status Assessment Patient has had a recent decline in their functional status and demonstrates the ability to make significant improvements in function in a reasonable and predictable amount of time.     Precautions / Restrictions Precautions Precautions: Fall Restrictions Weight Bearing Restrictions Per Provider Order: No      Mobility  Bed Mobility Overal bed mobility: Independent                  Transfers Overall transfer level: Needs assistance Equipment used: Rolling walker (2 wheels) Transfers: Sit to/from Stand Sit to Stand: Contact guard assist           General transfer comment: cues for hand placement     Ambulation/Gait Ambulation/Gait assistance: Min assist Gait Distance (Feet): 175 Feet Assistive device: Rolling walker (2 wheels) Gait Pattern/deviations: Step-to pattern, Decreased step length - right, Decreased step length - left, Decreased stride length, Shuffle, Leaning posteriorly, Drifts right/left, Trunk flexed, Wide base of support   Gait velocity interpretation: 1.31 - 2.62 ft/sec, indicative of limited community ambulator   General Gait Details: Pt was able to ambulate with RW with min assist for sequencing steps and RW as pt tends to not stay in proximity to RW.  Pt needed min assist for safety due to had to assist with RW a few times for safety.  Unsure if pt has some safety issues at baseline however. Pt with kyphotic posture throughout.   Stairs            Wheelchair Mobility     Tilt Bed    Modified Rankin (Stroke Patients Only)       Balance Overall balance assessment: Needs assistance Sitting-balance support: No upper extremity supported, Feet supported Sitting balance-Leahy Scale: Good     Standing balance support: Bilateral upper extremity supported, During functional activity, Reliant on assistive device for balance Standing balance-Leahy Scale: Poor Standing balance comment: relies on UE support for balance                             Pertinent Vitals/Pain Pain Assessment Pain Assessment: No/denies pain    Home Living Family/patient expects to be discharged to:: Private residence Living Arrangements: Children Available Help at Discharge: Family;Available 24  hours/day Type of Home: House Home Access: Stairs to enter Entrance Stairs-Rails: Left Entrance Stairs-Number of Steps: 6   Home Layout: One level Home Equipment: Rollator (4 wheels);Cane - single point;BSC/3in1;Shower seat;Grab bars - tub/shower;Grab bars - toilet;Hand held shower head      Prior Function               Mobility Comments: used rollator in yard, no  device in house ADLs Comments: B/D self, cooking     Extremity/Trunk Assessment   Upper Extremity Assessment Upper Extremity Assessment: Defer to OT evaluation    Lower Extremity Assessment Lower Extremity Assessment: Generalized weakness    Cervical / Trunk Assessment Cervical / Trunk Assessment: Kyphotic  Communication   Communication Communication: No apparent difficulties    Cognition Arousal: Alert Behavior During Therapy: WFL for tasks assessed/performed   PT - Cognitive impairments: No apparent impairments                         Following commands: Intact       Cueing       General Comments General comments (skin integrity, edema, etc.): 76 bpm, 93% 1Lm 130/72.  Pt didnt desaturate on RA with sats 93% and greater with walk however pt was SOB therefore replaced 1LO2.  End session 80 bpm HR and 121/79.    Exercises General Exercises - Lower Extremity Ankle Circles/Pumps: AROM, Both, 10 reps, Supine Long Arc Quad: AROM, Both, 10 reps, Seated Hip Flexion/Marching: AROM, Both, 10 reps, Standing   Assessment/Plan    PT Assessment Patient needs continued PT services  PT Problem List Decreased balance;Decreased activity tolerance;Decreased mobility;Decreased knowledge of use of DME;Decreased safety awareness;Decreased knowledge of precautions;Cardiopulmonary status limiting activity       PT Treatment Interventions DME instruction;Gait training;Functional mobility training;Therapeutic activities;Therapeutic exercise;Balance training;Patient/family education    PT Goals (Current goals can be found in the Care Plan section)  Acute Rehab PT Goals Patient Stated Goal: to go home PT Goal Formulation: With patient Time For Goal Achievement: 05/04/24 Potential to Achieve Goals: Good    Frequency Min 2X/week     Co-evaluation               AM-PAC PT 6 Clicks Mobility  Outcome Measure Help needed turning from your back to your side while in a  flat bed without using bedrails?: None Help needed moving from lying on your back to sitting on the side of a flat bed without using bedrails?: None Help needed moving to and from a bed to a chair (including a wheelchair)?: A Little Help needed standing up from a chair using your arms (e.g., wheelchair or bedside chair)?: A Little Help needed to walk in hospital room?: A Little Help needed climbing 3-5 steps with a railing? : A Lot 6 Click Score: 19    End of Session Equipment Utilized During Treatment: Gait belt;Oxygen Activity Tolerance: Patient limited by fatigue Patient left: in chair;with call bell/phone within reach;with chair alarm set Nurse Communication: Mobility status PT Visit Diagnosis: Muscle weakness (generalized) (M62.81)    Time: 1132-1207 PT Time Calculation (min) (ACUTE ONLY): 35 min   Charges:   PT Evaluation $PT Eval Moderate Complexity: 1 Mod PT Treatments $Gait Training: 8-22 mins PT General Charges $$ ACUTE PT VISIT: 1 Visit         Zakariah Urwin M,PT Acute Rehab Services [phone]   Stephane JULIANNA Bevel 04/20/2024, 1:27 PM

## 2024-04-20 NOTE — Discharge Instructions (Signed)

## 2024-04-20 NOTE — Progress Notes (Signed)
 PROGRESS NOTE    Pamela Robinson  FMW:[id number] DOB: 12-15-1932 DOA: 04/16/2024 PCP: Vernon Velna SAUNDERS, MD   89/F w history of hypertension, hyperlipidemia, osteoarthritis, presented to the ED with worsening dyspnea for 3 to 4 days .  Went to see her PCP, noted to be in A-fib, sent to the ED .  In the ED noted to be hypertensive with rate controlled A-fib,, labs noted D-dimer 3.3, proBNP 11.8 K, troponin negative, CTA chest noted no PE, large bilateral effusions with atelectasis, concern for right heart failure - Admitted, started on diuretics, echo pending, cards following  Subjective: -Just woke up, feels a little tired but better  Assessment and Plan:  Paroxysmal atrial fibrillation, new onset - Rate controlled at this time, continue Coreg  - Cards following, now switched to Eliquis  - Plan for cardioversion in 3 weeks  Acute diastolic CHF - Could be triggered by new A-fib - 2D echo noted EF 55-60%, severe LVH, mildly reduced RV - Repeat x-ray with bilateral effusions just not large enough for thoracentesis unfortunately -Uptrending creatinine will discontinue ARB, continue Aldactone  - Volume status improving, cards following switched to oral Lasix  -Increase activity, PT eval -Attempt to wean off O2   Hypertension- -improving, meds as above   AKI on CKD 3A- -discontinue ARB, see discussion above  Hypokalemia Replete   Hyperlipidemia-continue home statin   Depression-continue duloxetine , mirtazapine    Chronic osteoarthritis-resume home pain medications   DVT prophylaxis: Eliquis  Code Status: Full code Family Communication: No family at bedside Disposition Plan: Home likely tomorrow  Consultants:    Procedures:   Antimicrobials:    Objective: Vitals:   04/20/24 0712 04/20/24 0800 04/20/24 0849 04/20/24 0852  BP:  (!) 162/83 (!) 162/83 (!) 162/83  Pulse:   81   Resp:  20    Temp:  98.6 F (37 C)    TempSrc:  Oral    SpO2:      Weight: 60.1 kg      Height:        Intake/Output Summary (Last 24 hours) at 04/20/2024 1123 Last data filed at 04/20/2024 0500 Gross per 24 hour  Intake 240 ml  Output 1550 ml  Net -1310 ml   Filed Weights   04/18/24 0500 04/19/24 0408 04/20/24 0712  Weight: 55.9 kg 58.1 kg 60.1 kg    Examination:  General exam: Appears calm and comfortable, AO x 3, no distress -Attempt to wean off HEENT: no JVD Respiratory system: Decreased breath sounds in the bases, few basilar Rales Cardiovascular system: S1 & S2 heard, irregular rhythm Abd: nondistended, soft and nontender.Normal bowel sounds heard. Central nervous system: Alert and oriented. No focal neurological deficits. Extremities: no edema Skin: No rashes Psychiatry:  Mood & affect appropriate.     Data Reviewed:   CBC: Recent Labs  Lab 04/16/24 1812 04/17/24 0507 04/18/24 0426 04/19/24 0333  WBC 9.1 11.4* 9.4 11.9*  HGB 12.9 13.8 12.1 12.6  HCT 37.9 39.5 35.6* 38.9  MCV 86.7 84.8 85.8 89.2  PLT 237 251 195 223   Basic Metabolic Panel: Recent Labs  Lab 04/16/24 1812 04/16/24 1920 04/17/24 1506 04/18/24 0426 04/18/24 0828 04/19/24 0333 04/20/24 0553  NA 139  --  137 138  --  137 137  K 3.0*  --  3.4* 2.8*  --  4.8 4.0  CL 98  --  99 96*  --  98 98  CO2 29  --  26 26  --  29 26  GLUCOSE 112*  --  87 100*  --  102* 99  BUN 12  --  9 12  --  17 18  CREATININE 1.04*  --  1.03* 1.29*  --  1.48* 1.21*  CALCIUM 9.3  --  9.1 8.6*  --  8.9 9.0  MG  --  2.0  --   --  1.7  --   --    GFR: Estimated Creatinine Clearance: 26.1 mL/min (A) (by C-G formula based on SCr of 1.21 mg/dL (H)). Liver Function Tests: Recent Labs  Lab 04/17/24 1813  AST 22  ALT 10  ALKPHOS 83  BILITOT 1.2  PROT 6.9  ALBUMIN  3.6   No results for input(s): LIPASE, AMYLASE in the last 168 hours. No results for input(s): AMMONIA in the last 168 hours. Coagulation Profile: No results for input(s): INR, PROTIME in the last 168 hours. Cardiac  Enzymes: No results for input(s): CKTOTAL, CKMB, CKMBINDEX, TROPONINI in the last 168 hours. BNP (last 3 results) Recent Labs    04/16/24 1920  PROBNP 11,800.0*   HbA1C: No results for input(s): HGBA1C in the last 72 hours. CBG: Recent Labs  Lab 04/20/24 1028  GLUCAP 272*   Lipid Profile: Recent Labs    04/18/24 0426  CHOL 116  HDL 49  LDLCALC 55  TRIG 62  CHOLHDL 2.4   Thyroid  Function Tests: Recent Labs    04/17/24 1813  TSH 5.117*   Anemia Panel: No results for input(s): VITAMINB12, FOLATE, FERRITIN, TIBC, IRON, RETICCTPCT in the last 72 hours. Urine analysis:    Component Value Date/Time   COLORURINE YELLOW 08/22/2013 1502   APPEARANCEUR CLOUDY (A) 08/22/2013 1502   LABSPEC 1.015 08/22/2013 1502   PHURINE 6.0 08/22/2013 1502   GLUCOSEU NEGATIVE 08/22/2013 1502   HGBUR NEGATIVE 08/22/2013 1502   BILIRUBINUR NEGATIVE 08/22/2013 1502   KETONESUR NEGATIVE 08/22/2013 1502   PROTEINUR NEGATIVE 08/22/2013 1502   UROBILINOGEN 0.2 08/22/2013 1502   NITRITE NEGATIVE 08/22/2013 1502   LEUKOCYTESUR SMALL (A) 08/22/2013 1502   Sepsis Labs: @LABRCNTIP (procalcitonin:4,lacticidven:4)  ) Recent Results (from the past 240 hours)  MRSA Next Gen by PCR, Nasal     Status: None   Collection Time: 04/17/24  7:52 PM   Specimen: Nasal Mucosa; Nasal Swab  Result Value Ref Range Status   MRSA by PCR Next Gen NOT DETECTED NOT DETECTED Final    Comment: (NOTE) The GeneXpert MRSA Assay (FDA approved for NASAL specimens only), is one component of a comprehensive MRSA colonization surveillance program. It is not intended to diagnose MRSA infection nor to guide or monitor treatment for MRSA infections. Test performance is not FDA approved in patients less than 22 years old. Performed at [facility], [address], [place], [place] [address]      Radiology Studies: DG CHEST PORT 1 VIEW Result Date: 04/19/2024 EXAM: 1 VIEW(S) XRAY OF THE CHEST  04/19/2024 07:47:00 AM COMPARISON: CTA chest 04/16/2024. CLINICAL HISTORY: Pleural effusion. FINDINGS: LUNGS AND PLEURA: There are persisting small to moderate pleural effusions, slightly greater fluid on the left, with overlying lung opacities in the bases consistent with atelectasis or consolidation. The remaining lung fields are clear. No pneumothorax. Overall aeration seems unchanged. HEART AND MEDIASTINUM: There is stable cardiomegaly without evidence for CHF. There is a tortuous and calcified aorta with a stable mediastinum. BONES AND SOFT TISSUES: There is partially visible dorsal fusion hardware in the lower cervical spine. No new abnormality. IMPRESSION: 1. Persisting small to moderate pleural effusions, slightly greater on the left,  with overlying basilar opacities consistent with atelectasis or consolidation. 2. Stable cardiomegaly without evidence for CHF. Electronically signed by: Francis Quam MD 04/19/2024 07:55 AM EST RP Workstation: [id number]     Scheduled Meds:  amLODipine   5 mg Oral Daily   apixaban   2.5 mg Oral BID   carvedilol   6.25 mg Oral BID WC   DULoxetine   60 mg Oral Daily   furosemide   20 mg Oral Daily   insulin aspart  0-9 Units Subcutaneous TID WC   mirtazapine   15 mg Oral QHS   pregabalin   50 mg Oral QHS   simvastatin   40 mg Oral q1800   sodium chloride  flush  3 mL Intravenous Q12H   spironolactone   25 mg Oral Daily   Continuous Infusions:     LOS: 3 days    Time spent:    Sigurd Pac, MD [facility]   04/20/2024, 11:23 AM

## 2024-04-21 ENCOUNTER — Other Ambulatory Visit (HOSPITAL_COMMUNITY): Payer: Self-pay

## 2024-04-21 DIAGNOSIS — I4819 Other persistent atrial fibrillation: Secondary | ICD-10-CM | POA: Diagnosis not present

## 2024-04-21 DIAGNOSIS — I5031 Acute diastolic (congestive) heart failure: Secondary | ICD-10-CM | POA: Diagnosis not present

## 2024-04-21 LAB — BASIC METABOLIC PANEL WITH GFR
Anion gap: 10 (ref 5–15)
BUN: 19 mg/dL (ref 8–23)
CO2: 29 mmol/L (ref 22–32)
Calcium: 9.1 mg/dL (ref 8.9–10.3)
Chloride: 95 mmol/L — ABNORMAL LOW (ref 98–111)
Creatinine, Ser: 1.2 mg/dL — ABNORMAL HIGH (ref 0.44–1.00)
GFR, Estimated: 43 mL/min — ABNORMAL LOW (ref >60)
Glucose, Bld: 96 mg/dL (ref 70–99)
Potassium: 3.8 mmol/L (ref 3.5–5.1)
Sodium: 134 mmol/L — ABNORMAL LOW (ref 135–145)

## 2024-04-21 LAB — GLUCOSE, CAPILLARY
Glucose-Capillary: 104 mg/dL — ABNORMAL HIGH (ref 70–99)
Glucose-Capillary: 170 mg/dL — ABNORMAL HIGH (ref 70–99)

## 2024-04-21 LAB — HEMOGLOBIN A1C
Hgb A1c MFr Bld: 5.7 % — ABNORMAL HIGH (ref 4.8–5.6)
Mean Plasma Glucose: 117 mg/dL

## 2024-04-21 MED ORDER — FUROSEMIDE 20 MG PO TABS
20.0000 mg | ORAL_TABLET | Freq: Every day | ORAL | 0 refills | Status: DC
  Filled 2024-04-21: qty 60, 30d supply, fill #0

## 2024-04-21 MED ORDER — SPIRONOLACTONE 25 MG PO TABS
25.0000 mg | ORAL_TABLET | Freq: Every day | ORAL | 0 refills | Status: AC
  Filled 2024-04-21: qty 30, 30d supply, fill #0

## 2024-04-21 MED ORDER — CARVEDILOL 12.5 MG PO TABS
12.5000 mg | ORAL_TABLET | Freq: Two times a day (BID) | ORAL | Status: DC
  Administered 2024-04-21: 12.5 mg via ORAL
  Filled 2024-04-21: qty 1

## 2024-04-21 MED ORDER — APIXABAN 2.5 MG PO TABS
2.5000 mg | ORAL_TABLET | Freq: Two times a day (BID) | ORAL | 0 refills | Status: AC
  Filled 2024-04-21: qty 60, 30d supply, fill #0

## 2024-04-21 MED ORDER — CARVEDILOL 12.5 MG PO TABS
12.5000 mg | ORAL_TABLET | Freq: Two times a day (BID) | ORAL | 0 refills | Status: AC
  Filled 2024-04-21: qty 60, 30d supply, fill #0

## 2024-04-21 NOTE — Discharge Summary (Signed)
 Physician Discharge Summary  Pamela Robinson FMW:[id number] DOB: 17-May-1933 DOA: 04/16/2024  PCP: Vernon Velna SAUNDERS, MD  Admit date: 04/16/2024 Discharge date: 04/21/2024  Time spent: 45 minutes  Recommendations for Outpatient Follow-up:  Cardioversion in 3 weeks  Discharge Diagnoses:   Paroxysmal A-fib, new onset Acute diastolic CHF AKI on CKD 3a   Accelerated hypertension   Mild calcific aortic stenosis   Pleural effusion   Discharge Condition: Improving  Diet recommendation: Sodium, heart healthy  Filed Weights   04/19/24 0408 04/20/24 0712 04/21/24 0458  Weight: 58.1 kg 60.1 kg 58.3 kg    History of present illness:   89/F w history of hypertension, hyperlipidemia, osteoarthritis, presented to the ED with worsening dyspnea for 3 to 4 days .  Went to see her PCP, noted to be in A-fib, sent to the ED .  In the ED noted to be hypertensive with rate controlled A-fib,, labs noted D-dimer 3.3, proBNP 11.8 K, troponin negative, CTA chest noted no PE, large bilateral effusions with atelectasis, concern for right heart failure - Admitted, started on diuretics, echo pending, cards following  Hospital Course:   Paroxysmal atrial fibrillation, new onset - Rate controlled at this time, continue Coreg  - Cards following, now switched to Eliquis  - Plan for cardioversion in 3 weeks   Acute diastolic CHF - Could be triggered by new A-fib - 2D echo noted EF 55-60%, severe LVH, mildly reduced RV - Repeat x-ray with bilateral effusions just not large enough for thoracentesis unfortunately -Uptrending creatinine discontinued ARB, continue Aldactone  - Volume status improving, cards following switched to oral Lasix  - Working with physical therapy, weaned off O2   Hypertension- -improving, meds as above, restarted amlodipine  yesterday   AKI on CKD 3A- -discontinue ARB, see discussion above   Hypokalemia Replete   Hyperlipidemia-continue home statin   Depression-continue  duloxetine , mirtazapine    Chronic osteoarthritis-resume home pain medications Discharge Exam: Vitals:   04/21/24 0752 04/21/24 0836  BP: (!) 175/91 (!) 143/68  Pulse: 88   Resp: 18   Temp: (!) 97.5 F (36.4 C)   SpO2: 95%     General: AAO x 3 Cardiovascular: 1 S2, irregular rhythm Respiratory: Decreased breath sounds at the bases  Discharge Instructions   Discharge Instructions     Amb referral to [facility]   Complete by: As directed    Diet - low sodium heart healthy   Complete by: As directed    Increase activity slowly   Complete by: As directed       Allergies as of 04/21/2024       Reactions   Shellfish Allergy Swelling   Other Reaction(s): Not available   Codeine Nausea And Vomiting   Other Reaction(s): Not available, Unknown Other Reaction(s): Not available, Not available, Not available   Sulfa Antibiotics Nausea And Vomiting   Other Reaction(s): Not available Other Reaction(s): Unknown   Angiotensin Receptor Blockers Cough   Shellfish Protein-containing Drug Products    Other Reaction(s): Unknown        Medication List     STOP taking these medications    amoxicillin -clavulanate 875-125 MG tablet Commonly known as: AUGMENTIN    atenolol  100 MG tablet Commonly known as: TENORMIN    valsartan 320 MG tablet Commonly known as: DIOVAN       TAKE these medications    amLODipine  5 MG tablet Commonly known as: NORVASC  TAKE 1 TABLET (5 MG TOTAL) BY MOUTH DAILY.   apixaban  2.5 MG Tabs tablet Commonly known as: ELIQUIS  Take  1 tablet (2.5 mg total) by mouth 2 (two) times daily.   BIOTIN PO Take by mouth.   carvedilol  12.5 MG tablet Commonly known as: COREG  Take 1 tablet (12.5 mg total) by mouth 2 (two) times daily with a meal.   DULoxetine  60 MG capsule Commonly known as: CYMBALTA  Take 60 mg by mouth daily.   furosemide  20 MG tablet Commonly known as: LASIX  Take 1 tablet (20 mg total) by mouth daily. Take additional dose for weight  gain of 3lb in 1 day or 5lb in 1 week   MiraLax 17 g packet Generic drug: polyethylene glycol Take 17 g by mouth daily as needed.   mirtazapine  15 MG tablet Commonly known as: REMERON  3 TABLETS AT BEDTIME ORALLY 90 DAYS   Narcan 4 MG/0.1ML Liqd nasal spray kit Generic drug: naloxone Place 1 spray into the nose once.   Oxycodone  HCl 20 MG Tabs Take 1 tablet by mouth 3 (three) times daily as needed.   pregabalin  50 MG capsule Commonly known as: LYRICA  Take 1 capsule (50 mg total) by mouth at bedtime. APPOINTMENT NEEDED FOR FURTHER REFILLS What changed: Another medication with the same name was removed. Continue taking this medication, and follow the directions you see here.   simvastatin  40 MG tablet Commonly known as: ZOCOR  TAKE 1 TABLET BY MOUTH EVERY EVENING ONCE A DAY   spironolactone  25 MG tablet Commonly known as: ALDACTONE  Take 1 tablet (25 mg total) by mouth daily.   VITAMIN B-12 PO Take 1 tablet by mouth daily.   VITAMIN C PO Take 1 tablet by mouth daily.   VITAMIN D PO Take 1 tablet by mouth daily.   VITAMIN E PO Take 1 tablet by mouth daily.       Allergies  Allergen Reactions   Shellfish Allergy Swelling    Other Reaction(s): Not available   Codeine Nausea And Vomiting    Other Reaction(s): Not available, Unknown  Other Reaction(s): Not available, Not available, Not available   Sulfa Antibiotics Nausea And Vomiting    Other Reaction(s): Not available  Other Reaction(s): Unknown   Angiotensin Receptor Blockers Cough   Shellfish Protein-Containing Drug Products     Other Reaction(s): Unknown      The results of significant diagnostics from this hospitalization (including imaging, microbiology, ancillary and laboratory) are listed below for reference.    Significant Diagnostic Studies: DG CHEST PORT 1 VIEW Result Date: 04/19/2024 EXAM: 1 VIEW(S) XRAY OF THE CHEST 04/19/2024 07:47:00 AM COMPARISON: CTA chest 04/16/2024. CLINICAL HISTORY:  Pleural effusion. FINDINGS: LUNGS AND PLEURA: There are persisting small to moderate pleural effusions, slightly greater fluid on the left, with overlying lung opacities in the bases consistent with atelectasis or consolidation. The remaining lung fields are clear. No pneumothorax. Overall aeration seems unchanged. HEART AND MEDIASTINUM: There is stable cardiomegaly without evidence for CHF. There is a tortuous and calcified aorta with a stable mediastinum. BONES AND SOFT TISSUES: There is partially visible dorsal fusion hardware in the lower cervical spine. No new abnormality. IMPRESSION: 1. Persisting small to moderate pleural effusions, slightly greater on the left, with overlying basilar opacities consistent with atelectasis or consolidation. 2. Stable cardiomegaly without evidence for CHF. Electronically signed by: Francis Quam MD 04/19/2024 07:55 AM EST RP Workstation: [id number]   ECHOCARDIOGRAM COMPLETE Result Date: 04/18/2024    ECHOCARDIOGRAM REPORT   Patient Name:   Pamela Robinson Date of Exam: 04/18/2024 Medical Rec #:  [id number]          Height:  63.0 in Accession #:    [id number]         Weight:       123.2 lb Date of Birth:  1932-10-12         BSA:          1.574 m Patient Age:    89 years           BP:           151/88 mmHg Patient Gender: F                  HR:           79 bpm. Exam Location:  Inpatient Procedure: 2D Echo, Cardiac Doppler and Color Doppler (Both Spectral and Color            Flow Doppler were utilized during procedure). Indications:    CHF - Acute Systolic  History:        Patient has prior history of Echocardiogram examinations, most                 recent 10/13/2021. CHF, Signs/Symptoms:Fatigue and Murmur; Risk                 Factors:Hypertension and Dyslipidemia.  Sonographer:    Juliene Rucks Referring Phys: [phone] NILDA HERO GHERGHE IMPRESSIONS  1. Left ventricular ejection fraction, by estimation, is 55 to 60%. The left ventricle has normal function. The left  ventricle has no regional wall motion abnormalities. There is severe left ventricular hypertrophy. Left ventricular diastolic parameters  are indeterminate.  2. Right ventricular systolic function is mildly reduced. The right ventricular size is normal. There is mildly elevated pulmonary artery systolic pressure. The estimated right ventricular systolic pressure is 42.4 mmHg.  3. Left atrial size was severely dilated.  4. The mitral valve is degenerative. Trivial mitral valve regurgitation.  5. Tricuspid valve regurgitation is mild to moderate.  6. The aortic valve was not well visualized. Aortic valve regurgitation is not visualized. Mild aortic valve stenosis.  7. The inferior vena cava is normal in size with greater than 50% respiratory variability, suggesting right atrial pressure of 3 mmHg. FINDINGS  Left Ventricle: Left ventricular ejection fraction, by estimation, is 55 to 60%. The left ventricle has normal function. The left ventricle has no regional wall motion abnormalities. The left ventricular internal cavity size was small. There is severe left ventricular hypertrophy. Left ventricular diastolic parameters are indeterminate. Right Ventricle: The right ventricular size is normal. No increase in right ventricular wall thickness. Right ventricular systolic function is mildly reduced. There is mildly elevated pulmonary artery systolic pressure. The tricuspid regurgitant velocity  is 3.14 m/s, and with an assumed right atrial pressure of 3 mmHg, the estimated right ventricular systolic pressure is 42.4 mmHg. Left Atrium: Left atrial size was severely dilated. Right Atrium: Right atrial size was normal in size. Pericardium: There is no evidence of pericardial effusion. Mitral Valve: The mitral valve is degenerative in appearance. Trivial mitral valve regurgitation. Tricuspid Valve: The tricuspid valve is normal in structure. Tricuspid valve regurgitation is mild to moderate. Aortic Valve: The aortic valve was  not well visualized. Aortic valve regurgitation is not visualized. Mild aortic stenosis is present. Pulmonic Valve: The pulmonic valve was grossly normal. Pulmonic valve regurgitation is trivial. Aorta: The aortic root is normal in size and structure. Venous: The inferior vena cava is normal in size with greater than 50% respiratory variability, suggesting right atrial pressure of 3 mmHg. IAS/Shunts: The atrial septum  is grossly normal.  LEFT VENTRICLE PLAX 2D LVIDd:         3.90 cm   Diastology LVIDs:         2.60 cm   LV e' medial:    5.44 cm/s LV PW:         1.20 cm   LV E/e' medial:  18.8 LV IVS:        1.10 cm   LV e' lateral:   6.96 cm/s LVOT diam:     1.80 cm   LV E/e' lateral: 14.7 LV SV:         58 LV SV Index:   37 LVOT Area:     2.54 cm  RIGHT VENTRICLE          IVC RV Basal diam:  2.60 cm  IVC diam: 1.00 cm LEFT ATRIUM              Index        RIGHT ATRIUM           Index LA diam:        4.20 cm  2.67 cm/m   RA Area:     18.00 cm LA Vol (A2C):   56.5 ml  35.90 ml/m  RA Volume:   45.40 ml  28.84 ml/m LA Vol (A4C):   102.0 ml 64.80 ml/m LA Biplane Vol: 80.6 ml  51.21 ml/m  AORTIC VALVE LVOT Vmax:   147.00 cm/s LVOT Vmean:  91.300 cm/s LVOT VTI:    0.226 m  AORTA Ao Root diam: 2.80 cm MITRAL VALVE                TRICUSPID VALVE MV Area (PHT): 4.21 cm     TR Peak grad:   39.4 mmHg MV Decel Time: 180 msec     TR Vmax:        314.00 cm/s MR Peak grad: 64.3 mmHg MR Vmax:      401.00 cm/s   SHUNTS MV E velocity: 102.00 cm/s  Systemic VTI:  0.23 m                             Systemic Diam: 1.80 cm Lonni Nanas MD Electronically signed by Lonni Nanas MD Signature Date/Time: 04/18/2024/2:34:45 PM    Final    CT Angio Chest PE W and/or Wo Contrast Result Date: 04/16/2024 EXAM: CTA of the Chest with contrast for PE 04/16/2024 08:38:17 PM TECHNIQUE: CTA of the chest was performed after the administration of intravenous contrast. Multiplanar reformatted images are provided for review. MIP  images are provided for review. Automated exposure control, iterative reconstruction, and/or weight based adjustment of the mA/kV was utilized to reduce the radiation dose to as low as reasonably achievable. COMPARISON: Chest radiograph 04/16/2024 CLINICAL HISTORY: Pulmonary embolism (PE) suspected, low to intermediate prob, positive D-dimer. FINDINGS: LIMITATIONS/ARTIFACTS: Moderate motion artifact limits examination. PULMONARY ARTERIES: Pulmonary arteries are adequately opacified for evaluation. No pulmonary embolism. Main pulmonary artery is normal in caliber. MEDIASTINUM: Cardiac enlargement with particular right heart enlargement. Reflux of contrast material into the hepatic vein suggests right heart failure. No pericardial effusions. Calcification throughout the aorta and coronary arteries. LYMPH NODES: No mediastinal, hilar or axillary lymphadenopathy. LUNGS AND PLEURA: Large bilateral pleural effusions with basilar atelectasis. No pneumothorax. UPPER ABDOMEN: Limited images of the upper abdomen demonstrate reflux of contrast material into the hepatic vein, suggesting right heart failure. SOFT TISSUES AND BONES: Degenerative changes in  the spine. No acute bone or soft tissue abnormality. IMPRESSION: 1. No pulmonary embolism. 2. Large bilateral pleural effusions with basilar atelectasis. 3. Cardiomegaly with right heart enlargement and reflux of contrast into the hepatic veins, suggestive of right heart failure. Electronically signed by: Elsie Gravely MD 04/16/2024 08:44 PM EST RP Workstation: [id number]   DG Chest 2 View Result Date: 04/16/2024 CLINICAL DATA:  Chest pain, hypertension EXAM: CHEST - 2 VIEW COMPARISON:  09/25/2019 FINDINGS: Frontal and lateral views of the chest demonstrate an unremarkable cardiac silhouette. There is bibasilar consolidation with small bilateral pleural effusions, right greater than left. No pneumothorax. No acute bony abnormalities. Stable compression deformities at the  thoracolumbar junction. IMPRESSION: 1. Bibasilar consolidation, right greater than left, which may reflect airspace disease or atelectasis. 2. Small bilateral pleural effusions, right greater than left. Electronically Signed   By: Ozell Daring M.D.   On: 04/16/2024 19:28    Microbiology: Recent Results (from the past 240 hours)  MRSA Next Gen by PCR, Nasal     Status: None   Collection Time: 04/17/24  7:52 PM   Specimen: Nasal Mucosa; Nasal Swab  Result Value Ref Range Status   MRSA by PCR Next Gen NOT DETECTED NOT DETECTED Final    Comment: (NOTE) The GeneXpert MRSA Assay (FDA approved for NASAL specimens only), is one component of a comprehensive MRSA colonization surveillance program. It is not intended to diagnose MRSA infection nor to guide or monitor treatment for MRSA infections. Test performance is not FDA approved in patients less than 82 years old. Performed at [facility], [address], [place], [place] [address]      Labs: Basic Metabolic Panel: Recent Labs  Lab 04/16/24 1920 04/17/24 1506 04/18/24 0426 04/18/24 0828 04/19/24 0333 04/20/24 0553 04/21/24 0432  NA  --  137 138  --  137 137 134*  K  --  3.4* 2.8*  --  4.8 4.0 3.8  CL  --  99 96*  --  98 98 95*  CO2  --  26 26  --  29 26 29   GLUCOSE  --  87 100*  --  102* 99 96  BUN  --  9 12  --  17 18 19   CREATININE  --  1.03* 1.29*  --  1.48* 1.21* 1.20*  CALCIUM  --  9.1 8.6*  --  8.9 9.0 9.1  MG 2.0  --   --  1.7  --   --   --    Liver Function Tests: Recent Labs  Lab 04/17/24 1813  AST 22  ALT 10  ALKPHOS 83  BILITOT 1.2  PROT 6.9  ALBUMIN  3.6   No results for input(s): LIPASE, AMYLASE in the last 168 hours. No results for input(s): AMMONIA in the last 168 hours. CBC: Recent Labs  Lab 04/16/24 1812 04/17/24 0507 04/18/24 0426 04/19/24 0333  WBC 9.1 11.4* 9.4 11.9*  HGB 12.9 13.8 12.1 12.6  HCT 37.9 39.5 35.6* 38.9  MCV 86.7 84.8 85.8 89.2  PLT 237 251 195 223   Cardiac  Enzymes: No results for input(s): CKTOTAL, CKMB, CKMBINDEX, TROPONINI in the last 168 hours. BNP: BNP (last 3 results) No results for input(s): BNP in the last 8760 hours.  ProBNP (last 3 results) Recent Labs    04/16/24 1920  PROBNP 11,800.0*    CBG: Recent Labs  Lab 04/20/24 1252 04/20/24 1539 04/20/24 1726 04/20/24 2052 04/21/24 0829  GLUCAP 95 107* 107* 158* 104*  Signed:  Sigurd Pac MD.  [facility] 04/21/2024, 9:39 AM

## 2024-04-21 NOTE — Progress Notes (Addendum)
 Reviewed AVS, patient expressed understanding of medications, MD follow up reviewed.   Removed IV, Site clean, dry and intact.  CCMD contacted and informed patients is being discharged.  Patient states all belongings brought to the hospital at time of admission are accounted for and packed to take home. Patient states she feels nauseous and primary nurse notified.  PRN med given. Picked up medications from [facility] pharmacy. Nursing Staff contacted to transport patient to Discharge lounge to wait for transportation home.

## 2024-04-21 NOTE — Progress Notes (Signed)
  Progress Note  Patient Name: Pamela Robinson Date of Encounter: 04/21/2024 [facility] Cardiologist: Ozell Fell, MD   Interval Summary   No acute overnight events. Patient reports feeling relatively well. No new or acute complaints.   Vital Signs Vitals:   04/20/24 1734 04/20/24 1944 04/21/24 0413 04/21/24 0458  BP: (!) 153/83 131/84 (!) 142/95   Pulse: 92 79 81   Resp:  16 18   Temp:  98.3 F (36.8 C) 98.3 F (36.8 C)   TempSrc:  Oral Oral   SpO2:  92% 94%   Weight:    58.3 kg  Height:        Intake/Output Summary (Last 24 hours) at 04/21/2024 0733 Last data filed at 04/21/2024 0414 Gross per 24 hour  Intake 360 ml  Output --  Net 360 ml      04/21/2024    4:58 AM 04/20/2024    7:12 AM 04/19/2024    4:08 AM  Last 3 Weights  Weight (lbs) 128 lb 8.5 oz 132 lb 6.5 oz 128 lb  Weight (kg) 58.3 kg 60.06 kg 58.06 kg      Telemetry/ECG  AF - Personally Reviewed  Physical Exam  General: Well developed, in no acute distress.  Neck: No JVD.  Cardiac: Normal rate, regular rhythm.  Resp: Normal work of breathing.  Ext: No edema.  Neuro: No gross focal deficits.  Psych: Normal affect.   Assessment & Plan  89 y.o. female with a hx of hypertension, mild aortic stenosis, hyperlipidemia, and osteoarthritis who was seen 04/17/2024 for the evaluation of atrial fibrillation at the request of Nilda Fendt MD.    #Persistent atrial fibrillation: Presented to the ED after being referred by her PCP for an office visit for fatigue, lightheadedness dizziness and shortness of breath, patient was noted in new onset atrial fibrillation. -Continue Eliquis  2.5mg  BID yesterday -Increase carvedilol  to 12.5 mg twice daily.   -TEE and DCCV as outpatient once she has completed 3 weeks of uninterrupted oral anti-coagulation.   #Acute diastolic CHF: Likely precipitated by new AF. Presents with increased shortness of breath, proBNP 11,800.  CT showed large bilateral  pleural effusions. Echocardiogram showed EF 55 to 60%, severe LVH, mildly reduced RV function, mildly elevated pulmonary pressures, severe left atrial enlargement, mild to moderate tricuspid regurgitation -Has been diuresed with IV Lasix .  Had large pleural effusions on CT. CXR 11/27 persistent small to moderate pleural effusion. S/p IV lasix  40mg  x1. Transition to lasix  20mg  PO daily at discharge, with the option to take an additional dose for worsening dyspnea, edema, PND orthopnea, weight gain more than 3 pounds in 1 day.   #Hypertensive urgency: Presented with systolic BP greater than 200, has improved.  -Increase carvedilol  to 12.5 mg twice daily. Continue spironolactone  25 mg daily, amlodipine  5mg  daily.    Signed, Fonda Kitty, MD

## 2024-04-21 NOTE — Plan of Care (Signed)
   Problem: Activity: Goal: Risk for activity intolerance will decrease Outcome: Progressing

## 2024-04-26 ENCOUNTER — Other Ambulatory Visit: Payer: Self-pay | Admitting: Neurology

## 2024-04-26 NOTE — Telephone Encounter (Signed)
 Last seen on 10/11/22 per note  Continue to follow with PCP Return as needed   No follow up scheduled   Dispensed Days Supply Quantity Provider Pharmacy  PREGABALIN  50MG  CAP 03/23/2024 30 30 each Onita Duos, MD [facility] [phone] .SABRA      Rx pending for response.

## 2024-05-03 LAB — LAB REPORT - SCANNED: EGFR: 24

## 2024-05-08 ENCOUNTER — Ambulatory Visit (HOSPITAL_COMMUNITY): Admission: RE | Admit: 2024-05-08 | Discharge: 2024-05-08 | Attending: Internal Medicine | Admitting: Internal Medicine

## 2024-05-08 ENCOUNTER — Encounter (HOSPITAL_COMMUNITY): Payer: Self-pay | Admitting: Internal Medicine

## 2024-05-08 VITALS — BP 148/88 | HR 90 | Ht 63.0 in | Wt 123.0 lb

## 2024-05-08 DIAGNOSIS — I480 Paroxysmal atrial fibrillation: Secondary | ICD-10-CM

## 2024-05-08 DIAGNOSIS — I4819 Other persistent atrial fibrillation: Secondary | ICD-10-CM

## 2024-05-08 DIAGNOSIS — D6869 Other thrombophilia: Secondary | ICD-10-CM

## 2024-05-08 NOTE — Progress Notes (Signed)
 Primary Care Physician: Vernon Velna SAUNDERS, MD Primary Cardiologist: Ozell Fell, MD Electrophysiologist: None     Referring Physician: Dr. Fonda Kitty     Pamela Robinson is a 89 y.o. female with a history of hypertension, mild aortic stenosis hyperlipidemia, osteoarthritis, and persistent atrial fibrillation who presents for consultation in the [facility]. Patient is on Eliquis  for stroke prevention.  On evaluation today, patient is currently in A-fib.  Hospital admission 11/24-29 for acute diastolic CHF likely precipitated by new A-fib.  She is taking Coreg  12.5 mg twice daily.  No missed doses of Eliquis  2.5 mg twice daily.  Son in office visit today has had to give as needed Lasix  3-4 times since hospital discharge.  Today, she denies symptoms of palpitations, chest pain, shortness of breath, orthopnea, PND, lower extremity edema, dizziness, presyncope, syncope, bleeding, or neurologic sequela. The patient is tolerating medications without difficulties and is otherwise without complaint today.   she has a BMI of Body mass index is 21.79 kg/m.SABRA Filed Weights   05/08/24 1425  Weight: 55.8 kg    Current Outpatient Medications  Medication Sig Dispense Refill   amLODipine  (NORVASC ) 5 MG tablet TAKE 1 TABLET (5 MG TOTAL) BY MOUTH DAILY. 90 tablet 3   apixaban  (ELIQUIS ) 2.5 MG TABS tablet Take 1 tablet (2.5 mg total) by mouth 2 (two) times daily. 120 tablet 0   Ascorbic Acid (VITAMIN C PO) Take 1 tablet by mouth daily.     BIOTIN PO Take by mouth.     carvedilol  (COREG ) 12.5 MG tablet Take 1 tablet (12.5 mg total) by mouth 2 (two) times daily with a meal. 60 tablet 0   Cyanocobalamin  (VITAMIN B-12 PO) Take 1 tablet by mouth daily.     DULoxetine  (CYMBALTA ) 60 MG capsule Take 60 mg by mouth daily.     furosemide  (LASIX ) 20 MG tablet Take 1 tablet (20 mg total) by mouth daily. Take additional dose for weight gain of 3lb in 1 day or 5lb in 1 week 60  tablet 0   mirtazapine  (REMERON ) 15 MG tablet 3 TABLETS AT BEDTIME ORALLY 90 DAYS  1   naloxone (NARCAN) nasal spray 4 mg/0.1 mL Place 1 spray into the nose once.     Oxycodone  HCl 20 MG TABS Take 1 tablet by mouth 3 (three) times daily as needed.     polyethylene glycol (MIRALAX) 17 g packet Take 17 g by mouth daily as needed.     pregabalin  (LYRICA ) 50 MG capsule TAKE 1 CAPSULE BY MOUTH AT BEDTIME . APPOINTMENT REQUIRED FOR FUTURE REFILLS 30 capsule 5   simvastatin  (ZOCOR ) 40 MG tablet TAKE 1 TABLET BY MOUTH EVERY EVENING ONCE A DAY  5   spironolactone  (ALDACTONE ) 25 MG tablet Take 1 tablet (25 mg total) by mouth daily. 30 tablet 0   VITAMIN D PO Take 1 tablet by mouth daily.     VITAMIN E PO Take 1 tablet by mouth daily.     No current facility-administered medications for this encounter.    Atrial Fibrillation Management history:  Previous antiarrhythmic drugs: None Previous cardioversions: None Previous ablations: None Anticoagulation history: Eliquis  2.5 mg twice daily   ROS- All systems are reviewed and negative except as per the HPI above.  Physical Exam: BP (!) 148/88   Pulse 90   Ht 5' 3 (1.6 m)   Wt 55.8 kg   BMI 21.79 kg/m   GEN: Well nourished, well developed in no acute  distress NECK: No JVD; No carotid bruits CARDIAC: Irregularly irregular rate and rhythm, no murmurs, rubs, gallops RESPIRATORY:  Clear to auscultation without rales, wheezing or rhonchi  ABDOMEN: Soft, non-tender, non-distended EXTREMITIES:  No edema; No deformity   EKG today demonstrates  EKG Interpretation Date/Time:  Tuesday May 08 2024 14:28:14 EST Ventricular Rate:  90 PR Interval:    QRS Duration:  128 QT Interval:  382 QTC Calculation: 467 R Axis:   208  Text Interpretation: Atrial fibrillation Non-specific intra-ventricular conduction block Abnormal ECG When compared with ECG of 17-Apr-2024 18:55, PREVIOUS ECG IS PRESENT Confirmed by Terra Pac ([id number]) on 05/08/2024  2:31:20 PM    Echo 04/18/2024 demonstrated   1. Left ventricular ejection fraction, by estimation, is 55 to 60%. The  left ventricle has normal function. The left ventricle has no regional  wall motion abnormalities. There is severe left ventricular hypertrophy.  Left ventricular diastolic parameters   are indeterminate.   2. Right ventricular systolic function is mildly reduced. The right  ventricular size is normal. There is mildly elevated pulmonary artery  systolic pressure. The estimated right ventricular systolic pressure is  42.4 mmHg.   3. Left atrial size was severely dilated.   4. The mitral valve is degenerative. Trivial mitral valve regurgitation.   5. Tricuspid valve regurgitation is mild to moderate.   6. The aortic valve was not well visualized. Aortic valve regurgitation  is not visualized. Mild aortic valve stenosis.   7. The inferior vena cava is normal in size with greater than 50%  respiratory variability, suggesting right atrial pressure of 3 mmHg.   ASSESSMENT & PLAN CHA2DS2-VASc Score = 5  The patient's score is based upon: CHF History: 0 HTN History: 1 Diabetes History: 0 Stroke History: 0 Vascular Disease History: 1 Age Score: 2 Gender Score: 1       ASSESSMENT AND PLAN: Persistent Atrial Fibrillation (ICD10:  I48.19) The patient's CHA2DS2-VASc score is 5, indicating a 7.2% annual risk of stroke.    Patient is currently in A-fib.  Continue Coreg  12.5 mg twice daily. We discussed the procedure cardioversion to try to convert to NSR. We discussed the risks vs benefits of this procedure and how ultimately we cannot predict whether a patient will have early return of arrhythmia post procedure. After discussion, the patient wishes to proceed with cardioversion. Labs drawn on 11/27 and 11/29.  Informed Consent   Shared Decision Making/Informed Consent The risks (stroke, cardiac arrhythmias rarely resulting in the need for a temporary or permanent  pacemaker, skin irritation or burns and complications associated with conscious sedation including aspiration, arrhythmia, respiratory failure and death), benefits (restoration of normal sinus rhythm) and alternatives of a direct current cardioversion were explained in detail to Pamela Robinson and she agrees to proceed.       Secondary Hypercoagulable State (ICD10:  D68.69) The patient is at significant risk for stroke/thromboembolism based upon her CHA2DS2-VASc Score of 5.  Continue Apixaban  (Eliquis ).  No missed doses of Eliquis .  Continue Eliquis  2.5 mg twice daily without interruption.     Follow up 2 weeks after DCCV.   Terra Pac, [facility]  [facility] [address] [place], [place] [address] [phone]

## 2024-05-08 NOTE — Patient Instructions (Signed)
 Cardioversion scheduled for: 05/14/24 Monday 8;00 AM   - Arrive at the [facility] ([address]  and check in with ADMITTING at 8:00 AM    - Do not eat or drink anything after midnight the night prior to your procedure.   - Take all your morning medication (except diabetic medications) with a sip of water prior to arrival.  - Do NOT miss any doses of your blood thinner - if you should miss a dose or take a dose more than 4 hours late -- please notify our office immediately.  - You will not be able to drive home after your procedure. Please ensure you have a responsible adult to drive you home. You will need someone with you for 24 hours post procedure.     - Expect to be in the procedural area approximately 2 hours.   - If you feel as if you go back into normal rhythm prior to scheduled cardioversion, please notify our office immediately.   If your procedure is canceled in the cardioversion suite you will be charged a cancellation fee.

## 2024-05-08 NOTE — H&P (View-Only) (Signed)
 Primary Care Physician: Vernon Velna SAUNDERS, MD Primary Cardiologist: Ozell Fell, MD Electrophysiologist: None     Referring Physician: Dr. Fonda Kitty     Pamela Robinson is a 89 y.o. female with a history of hypertension, mild aortic stenosis hyperlipidemia, osteoarthritis, and persistent atrial fibrillation who presents for consultation in the [facility]. Patient is on Eliquis  for stroke prevention.  On evaluation today, patient is currently in A-fib.  Hospital admission 11/24-29 for acute diastolic CHF likely precipitated by new A-fib.  She is taking Coreg  12.5 mg twice daily.  No missed doses of Eliquis  2.5 mg twice daily.  Son in office visit today has had to give as needed Lasix  3-4 times since hospital discharge.  Today, she denies symptoms of palpitations, chest pain, shortness of breath, orthopnea, PND, lower extremity edema, dizziness, presyncope, syncope, bleeding, or neurologic sequela. The patient is tolerating medications without difficulties and is otherwise without complaint today.   she has a BMI of Body mass index is 21.79 kg/m.SABRA Filed Weights   05/08/24 1425  Weight: 55.8 kg    Current Outpatient Medications  Medication Sig Dispense Refill   amLODipine  (NORVASC ) 5 MG tablet TAKE 1 TABLET (5 MG TOTAL) BY MOUTH DAILY. 90 tablet 3   apixaban  (ELIQUIS ) 2.5 MG TABS tablet Take 1 tablet (2.5 mg total) by mouth 2 (two) times daily. 120 tablet 0   Ascorbic Acid (VITAMIN C PO) Take 1 tablet by mouth daily.     BIOTIN PO Take by mouth.     carvedilol  (COREG ) 12.5 MG tablet Take 1 tablet (12.5 mg total) by mouth 2 (two) times daily with a meal. 60 tablet 0   Cyanocobalamin  (VITAMIN B-12 PO) Take 1 tablet by mouth daily.     DULoxetine  (CYMBALTA ) 60 MG capsule Take 60 mg by mouth daily.     furosemide  (LASIX ) 20 MG tablet Take 1 tablet (20 mg total) by mouth daily. Take additional dose for weight gain of 3lb in 1 day or 5lb in 1 week 60  tablet 0   mirtazapine  (REMERON ) 15 MG tablet 3 TABLETS AT BEDTIME ORALLY 90 DAYS  1   naloxone (NARCAN) nasal spray 4 mg/0.1 mL Place 1 spray into the nose once.     Oxycodone  HCl 20 MG TABS Take 1 tablet by mouth 3 (three) times daily as needed.     polyethylene glycol (MIRALAX) 17 g packet Take 17 g by mouth daily as needed.     pregabalin  (LYRICA ) 50 MG capsule TAKE 1 CAPSULE BY MOUTH AT BEDTIME . APPOINTMENT REQUIRED FOR FUTURE REFILLS 30 capsule 5   simvastatin  (ZOCOR ) 40 MG tablet TAKE 1 TABLET BY MOUTH EVERY EVENING ONCE A DAY  5   spironolactone  (ALDACTONE ) 25 MG tablet Take 1 tablet (25 mg total) by mouth daily. 30 tablet 0   VITAMIN D PO Take 1 tablet by mouth daily.     VITAMIN E PO Take 1 tablet by mouth daily.     No current facility-administered medications for this encounter.    Atrial Fibrillation Management history:  Previous antiarrhythmic drugs: None Previous cardioversions: None Previous ablations: None Anticoagulation history: Eliquis  2.5 mg twice daily   ROS- All systems are reviewed and negative except as per the HPI above.  Physical Exam: BP (!) 148/88   Pulse 90   Ht 5' 3 (1.6 m)   Wt 55.8 kg   BMI 21.79 kg/m   GEN: Well nourished, well developed in no acute  distress NECK: No JVD; No carotid bruits CARDIAC: Irregularly irregular rate and rhythm, no murmurs, rubs, gallops RESPIRATORY:  Clear to auscultation without rales, wheezing or rhonchi  ABDOMEN: Soft, non-tender, non-distended EXTREMITIES:  No edema; No deformity   EKG today demonstrates  EKG Interpretation Date/Time:  Tuesday May 08 2024 14:28:14 EST Ventricular Rate:  90 PR Interval:    QRS Duration:  128 QT Interval:  382 QTC Calculation: 467 R Axis:   208  Text Interpretation: Atrial fibrillation Non-specific intra-ventricular conduction block Abnormal ECG When compared with ECG of 17-Apr-2024 18:55, PREVIOUS ECG IS PRESENT Confirmed by Terra Pac ([id number]) on 05/08/2024  2:31:20 PM    Echo 04/18/2024 demonstrated   1. Left ventricular ejection fraction, by estimation, is 55 to 60%. The  left ventricle has normal function. The left ventricle has no regional  wall motion abnormalities. There is severe left ventricular hypertrophy.  Left ventricular diastolic parameters   are indeterminate.   2. Right ventricular systolic function is mildly reduced. The right  ventricular size is normal. There is mildly elevated pulmonary artery  systolic pressure. The estimated right ventricular systolic pressure is  42.4 mmHg.   3. Left atrial size was severely dilated.   4. The mitral valve is degenerative. Trivial mitral valve regurgitation.   5. Tricuspid valve regurgitation is mild to moderate.   6. The aortic valve was not well visualized. Aortic valve regurgitation  is not visualized. Mild aortic valve stenosis.   7. The inferior vena cava is normal in size with greater than 50%  respiratory variability, suggesting right atrial pressure of 3 mmHg.   ASSESSMENT & PLAN CHA2DS2-VASc Score = 5  The patient's score is based upon: CHF History: 0 HTN History: 1 Diabetes History: 0 Stroke History: 0 Vascular Disease History: 1 Age Score: 2 Gender Score: 1       ASSESSMENT AND PLAN: Persistent Atrial Fibrillation (ICD10:  I48.19) The patient's CHA2DS2-VASc score is 5, indicating a 7.2% annual risk of stroke.    Patient is currently in A-fib.  Continue Coreg  12.5 mg twice daily. We discussed the procedure cardioversion to try to convert to NSR. We discussed the risks vs benefits of this procedure and how ultimately we cannot predict whether a patient will have early return of arrhythmia post procedure. After discussion, the patient wishes to proceed with cardioversion. Labs drawn on 11/27 and 11/29.  Informed Consent   Shared Decision Making/Informed Consent The risks (stroke, cardiac arrhythmias rarely resulting in the need for a temporary or permanent  pacemaker, skin irritation or burns and complications associated with conscious sedation including aspiration, arrhythmia, respiratory failure and death), benefits (restoration of normal sinus rhythm) and alternatives of a direct current cardioversion were explained in detail to Pamela Robinson and she agrees to proceed.       Secondary Hypercoagulable State (ICD10:  D68.69) The patient is at significant risk for stroke/thromboembolism based upon her CHA2DS2-VASc Score of 5.  Continue Apixaban  (Eliquis ).  No missed doses of Eliquis .  Continue Eliquis  2.5 mg twice daily without interruption.     Follow up 2 weeks after DCCV.   Terra Pac, [facility]  [facility] [address] [place], [place] [address] [phone]

## 2024-05-11 NOTE — Progress Notes (Signed)
 Pt called for pre procedure instructions.  Unable to leave voicemail, not set up. Arrival time 0915 NPO after midnight explained Instructed to take am meds with sip of water and confirmed blood thinner consistency Instructed pt need for ride home tomorrow and have responsible adult with them for 24 hrs post procedure.

## 2024-05-14 ENCOUNTER — Encounter (HOSPITAL_COMMUNITY): Payer: Self-pay | Admitting: Internal Medicine

## 2024-05-14 ENCOUNTER — Encounter (HOSPITAL_COMMUNITY): Admission: RE | Payer: Self-pay | Source: Home / Self Care

## 2024-05-14 ENCOUNTER — Other Ambulatory Visit: Payer: Self-pay

## 2024-05-14 ENCOUNTER — Ambulatory Visit (HOSPITAL_COMMUNITY): Admitting: Certified Registered Nurse Anesthetist

## 2024-05-14 ENCOUNTER — Ambulatory Visit (HOSPITAL_COMMUNITY)
Admission: RE | Admit: 2024-05-14 | Discharge: 2024-05-14 | Disposition: A | Discharge status: Home / Self Care | Attending: Internal Medicine | Admitting: Internal Medicine

## 2024-05-14 DIAGNOSIS — I4819 Other persistent atrial fibrillation: Secondary | ICD-10-CM

## 2024-05-14 DIAGNOSIS — I110 Hypertensive heart disease with heart failure: Secondary | ICD-10-CM | POA: Diagnosis not present

## 2024-05-14 DIAGNOSIS — D6869 Other thrombophilia: Secondary | ICD-10-CM | POA: Diagnosis not present

## 2024-05-14 DIAGNOSIS — I5031 Acute diastolic (congestive) heart failure: Secondary | ICD-10-CM | POA: Insufficient documentation

## 2024-05-14 DIAGNOSIS — E785 Hyperlipidemia, unspecified: Secondary | ICD-10-CM | POA: Insufficient documentation

## 2024-05-14 DIAGNOSIS — I5033 Acute on chronic diastolic (congestive) heart failure: Secondary | ICD-10-CM | POA: Diagnosis not present

## 2024-05-14 DIAGNOSIS — Z7901 Long term (current) use of anticoagulants: Secondary | ICD-10-CM | POA: Insufficient documentation

## 2024-05-14 DIAGNOSIS — I350 Nonrheumatic aortic (valve) stenosis: Secondary | ICD-10-CM | POA: Diagnosis not present

## 2024-05-14 DIAGNOSIS — Z79899 Other long term (current) drug therapy: Secondary | ICD-10-CM | POA: Diagnosis not present

## 2024-05-14 HISTORY — PX: CARDIOVERSION: EP1203

## 2024-05-14 SURGERY — CARDIOVERSION (CATH LAB)
Anesthesia: General

## 2024-05-14 MED ORDER — SODIUM CHLORIDE 0.9% FLUSH
INTRAVENOUS | Status: DC | PRN
  Administered 2024-05-14: 5 mL via INTRAVENOUS

## 2024-05-14 MED ORDER — PROPOFOL 10 MG/ML IV BOLUS
INTRAVENOUS | Status: DC | PRN
  Administered 2024-05-14: 40 mg via INTRAVENOUS

## 2024-05-14 MED ORDER — SODIUM CHLORIDE 0.9 % IV SOLN: INTRAVENOUS | Status: DC

## 2024-05-14 SURGICAL SUPPLY — 1 items: PAD DEFIB RADIO PHYSIO CONN (PAD) ×1 IMPLANT

## 2024-05-14 NOTE — Interval H&P Note (Signed)
 History and Physical Interval Note:  05/14/2024 9:04 AM  Pamela Robinson  has presented today for surgery, with the diagnosis of afib.  The various methods of treatment have been discussed with the patient and family. After consideration of risks, benefits and other options for treatment, the patient has consented to  Procedures: CARDIOVERSION (N/A) as a surgical intervention.  The patient's history has been reviewed, patient examined, no change in status, stable for surgery.  I have reviewed the patient's chart and labs.  Questions were answered to the patient's satisfaction.     Pamela Robinson A Marce Schartz

## 2024-05-14 NOTE — Transfer of Care (Signed)
 Immediate Anesthesia Transfer of Care Note  Patient: Pamela Robinson   Procedure(s) Performed: CARDIOVERSION  Patient Location: Cath Lab  Anesthesia Type:General  Level of Consciousness: drowsy and patient cooperative  Airway & Oxygen Therapy: Patient Spontanous Breathing and Patient connected to nasal cannula oxygen  Post-op Assessment: Report given to RN, Post -op Vital signs reviewed and stable, and Patient moving all extremities X 4  Post vital signs: Reviewed and stable  Last Vitals:  Vitals Value Taken Time  BP 148/59 05/14/24 10:06  Temp    Pulse 69 05/14/24 10:06  Resp 20 05/14/24 10:06  SpO2 98 % 05/14/24 10:06    Last Pain:  Vitals:   05/14/24 0755  TempSrc: Temporal         Complications: No notable events documented.

## 2024-05-14 NOTE — Anesthesia Preprocedure Evaluation (Addendum)
 "                                  Anesthesia Evaluation  Patient identified by MRN, date of birth, ID band Patient awake    Reviewed: Allergy & Precautions, NPO status , Patient's Chart, lab work & pertinent test results  History of Anesthesia Complications Negative for: history of anesthetic complications  Airway Mallampati: III  TM Distance: >3 FB     Dental  (+) Dental Advisory Given   Pulmonary neg recent URI   breath sounds clear to auscultation       Cardiovascular hypertension, (-) angina +CHF  + dysrhythmias Atrial Fibrillation  Rhythm:Irregular   1. Left ventricular ejection fraction, by estimation, is 55 to 60%. The  left ventricle has normal function. The left ventricle has no regional  wall motion abnormalities. There is severe left ventricular hypertrophy.  Left ventricular diastolic parameters   are indeterminate.   2. Right ventricular systolic function is mildly reduced. The right  ventricular size is normal. There is mildly elevated pulmonary artery  systolic pressure. The estimated right ventricular systolic pressure is  42.4 mmHg.   3. Left atrial size was severely dilated.   4. The mitral valve is degenerative. Trivial mitral valve regurgitation.   5. Tricuspid valve regurgitation is mild to moderate.   6. The aortic valve was not well visualized. Aortic valve regurgitation  is not visualized. Mild aortic valve stenosis.   7. The inferior vena cava is normal in size with greater than 50%  respiratory variability, suggesting right atrial pressure of 3 mmHg.     Neuro/Psych  Headaches  Neuromuscular disease    GI/Hepatic negative GI ROS, Neg liver ROS,,,  Endo/Other  negative endocrine ROS    Renal/GU Renal InsufficiencyRenal diseaseLab Results      Component                Value               Date                      NA                       134 (L)             04/21/2024                K                        3.8                  04/21/2024                CO2                      29                  04/21/2024                GLUCOSE                  96                  04/21/2024                BUN  19                  04/21/2024                CREATININE               1.20 (H)            04/21/2024                CALCIUM                  9.1                 04/21/2024                EGFR                     24.0                05/03/2024                GFRNONAA                 43 (L)              04/21/2024                Musculoskeletal  (+) Arthritis ,    Abdominal   Peds  Hematology  (+) Blood dyscrasia Lab Results      Component                Value               Date                      WBC                      11.9 (H)            04/19/2024                HGB                      12.6                04/19/2024                HCT                      38.9                04/19/2024                MCV                      89.2                04/19/2024                PLT                      223                 04/19/2024             eliquis    Anesthesia Other Findings   Reproductive/Obstetrics  Anesthesia Physical Anesthesia Plan  ASA: 3  Anesthesia Plan: General   Post-op Pain Management: Minimal or no pain anticipated   Induction: Intravenous  PONV Risk Score and Plan: 2 and Treatment may vary due to age or medical condition  Airway Management Planned: Nasal Cannula, Natural Airway and Simple Face Mask  Additional Equipment: None  Intra-op Plan:   Post-operative Plan:   Informed Consent: I have reviewed the patients History and Physical, chart, labs and discussed the procedure including the risks, benefits and alternatives for the proposed anesthesia with the patient or authorized representative who has indicated his/her understanding and acceptance.     Dental advisory given  Plan Discussed with:  CRNA  Anesthesia Plan Comments:          Anesthesia Quick Evaluation  "

## 2024-05-14 NOTE — Discharge Instructions (Signed)

## 2024-05-14 NOTE — CV Procedure (Signed)
 Procedure: Electrical Cardioversion Indications:  Atrial Fibrillation  Procedure Details:  Consent: Risks of procedure as well as the alternatives and risks of each were explained to the (patient/caregiver).  Consent for procedure obtained.  Time Out: Verified patient identification, verified procedure, site/side was marked, verified correct patient position, special equipment/implants available, medications/allergies/relevent history reviewed, required imaging and test results available. PERFORMED.  Patient placed on cardiac monitor, pulse oximetry, supplemental oxygen as necessary.  Sedation given: propofol  per anesthesia Pacer pads placed anterior and posterior chest.  Cardioverted 1 time(s).  Cardioversion with synchronized biphasic 200J shock.  Evaluation: Findings: Post procedure EKG shows: NSR Complications: None Patient did tolerate procedure well.  Time Spent Directly with the Patient:  15 minutes   Pamela Robinson 05/14/2024, 10:07 AM

## 2024-05-15 ENCOUNTER — Encounter (HOSPITAL_COMMUNITY): Payer: Self-pay | Admitting: Internal Medicine

## 2024-05-16 ENCOUNTER — Other Ambulatory Visit: Payer: Self-pay

## 2024-05-16 DIAGNOSIS — I350 Nonrheumatic aortic (valve) stenosis: Secondary | ICD-10-CM

## 2024-05-16 NOTE — Anesthesia Postprocedure Evaluation (Signed)
"   Anesthesia Post Note  Patient: Pamela Robinson  Procedure(s) Performed: CARDIOVERSION     Patient location during evaluation: Cath Lab Anesthesia Type: General Level of consciousness: awake and alert Pain management: pain level controlled Vital Signs Assessment: post-procedure vital signs reviewed and stable Respiratory status: spontaneous breathing, nonlabored ventilation and respiratory function stable Cardiovascular status: blood pressure returned to baseline and stable Postop Assessment: no apparent nausea or vomiting Anesthetic complications: no   No notable events documented.                 Dallon Dacosta      "

## 2024-05-21 ENCOUNTER — Other Ambulatory Visit: Payer: Self-pay | Admitting: Internal Medicine

## 2024-05-21 DIAGNOSIS — Z1231 Encounter for screening mammogram for malignant neoplasm of breast: Secondary | ICD-10-CM

## 2024-05-28 ENCOUNTER — Ambulatory Visit (HOSPITAL_COMMUNITY)

## 2024-05-29 ENCOUNTER — Ambulatory Visit (HOSPITAL_COMMUNITY)
Admission: RE | Admit: 2024-05-29 | Discharge: 2024-05-29 | Disposition: A | Discharge status: Home / Self Care | Source: Ambulatory Visit | Attending: Internal Medicine | Admitting: Internal Medicine

## 2024-05-29 ENCOUNTER — Encounter (HOSPITAL_COMMUNITY): Payer: Self-pay | Admitting: Internal Medicine

## 2024-05-29 VITALS — BP 128/72 | HR 79 | Ht 63.0 in | Wt 123.9 lb

## 2024-05-29 DIAGNOSIS — I4819 Other persistent atrial fibrillation: Secondary | ICD-10-CM

## 2024-05-29 DIAGNOSIS — D6869 Other thrombophilia: Secondary | ICD-10-CM | POA: Diagnosis not present

## 2024-05-29 NOTE — Progress Notes (Addendum)
 "   Primary Care Physician: Vernon Velna SAUNDERS, MD Primary Cardiologist: Ozell Fell, MD Electrophysiologist: None     Referring Physician: Dr. Fonda Kitty     Pamela Robinson is a 89 y.o. female with a history of hypertension, mild aortic stenosis hyperlipidemia, osteoarthritis, and persistent atrial fibrillation who presents for consultation in the [facility]. Patient is on Eliquis  for stroke prevention.  On evaluation today, patient is currently in A-fib.  Hospital admission 11/24-29 for acute diastolic CHF likely precipitated by new A-fib.  She is taking Coreg  12.5 mg twice daily.  No missed doses of Eliquis  2.5 mg twice daily.  Son in office visit today has had to give PRN Lasix  3-4 times since hospital discharge.  Follow-up 05/29/2024.  Patient is currently in NSR.  Son with patient notes she appears to be doing much better. S/p successful DCCV on 05/14/2024.  No missed doses of Eliquis .  Today, she denies symptoms of palpitations, chest pain, shortness of breath, orthopnea, PND, lower extremity edema, dizziness, presyncope, syncope, bleeding, or neurologic sequela. The patient is tolerating medications without difficulties and is otherwise without complaint today.   she has a BMI of Body mass index is 21.95 kg/m.SABRA Filed Weights   05/29/24 1506  Weight: 56.2 kg     Current Outpatient Medications  Medication Sig Dispense Refill   amLODipine  (NORVASC ) 5 MG tablet TAKE 1 TABLET (5 MG TOTAL) BY MOUTH DAILY. 90 tablet 3   apixaban  (ELIQUIS ) 2.5 MG TABS tablet Take 1 tablet (2.5 mg total) by mouth 2 (two) times daily. 120 tablet 0   Ascorbic Acid (VITAMIN C PO) Take 1 tablet by mouth daily.     BIOTIN PO Take by mouth.     carvedilol  (COREG ) 12.5 MG tablet Take 1 tablet (12.5 mg total) by mouth 2 (two) times daily with a meal. 60 tablet 0   Cyanocobalamin  (VITAMIN B-12 PO) Take 1 tablet by mouth daily.     DULoxetine  (CYMBALTA ) 60 MG capsule Take 60  mg by mouth daily.     furosemide  (LASIX ) 20 MG tablet Take 1 tablet (20 mg total) by mouth daily. Take additional dose for weight gain of 3lb in 1 day or 5lb in 1 week 60 tablet 0   mirtazapine  (REMERON ) 15 MG tablet 3 TABLETS AT BEDTIME ORALLY 90 DAYS  1   naloxone (NARCAN) nasal spray 4 mg/0.1 mL Place 1 spray into the nose once.     Oxycodone  HCl 20 MG TABS Take 1 tablet by mouth 3 (three) times daily as needed.     polyethylene glycol (MIRALAX) 17 g packet Take 17 g by mouth daily as needed.     pregabalin  (LYRICA ) 50 MG capsule TAKE 1 CAPSULE BY MOUTH AT BEDTIME . APPOINTMENT REQUIRED FOR FUTURE REFILLS 30 capsule 5   simvastatin  (ZOCOR ) 40 MG tablet TAKE 1 TABLET BY MOUTH EVERY EVENING ONCE A DAY  5   spironolactone  (ALDACTONE ) 25 MG tablet Take 1 tablet (25 mg total) by mouth daily. 30 tablet 0   VITAMIN D PO Take 1 tablet by mouth daily.     VITAMIN E PO Take 1 tablet by mouth daily.     No current facility-administered medications for this encounter.    Atrial Fibrillation Management history:  Previous antiarrhythmic drugs: None Previous cardioversions: None Previous ablations: None Anticoagulation history: Eliquis  2.5 mg twice daily   ROS- All systems are reviewed and negative except as per the HPI above.  Physical Exam: BP  128/72   Pulse 79   Ht 5' 3 (1.6 m)   Wt 56.2 kg   BMI 21.95 kg/m   GEN- The patient is well appearing, alert and oriented x 3 today.   Neck - no JVD or carotid bruit noted Lungs- Clear to ausculation bilaterally, normal work of breathing Heart- Regular rate and rhythm, 2/6 systolic murmur; no rubs or gallops, PMI not laterally displaced Extremities- no clubbing, cyanosis, or edema Skin - no rash or ecchymosis noted'   EKG today demonstrates  EKG Interpretation Date/Time:  Tuesday May 29 2024 15:09:15 EST Ventricular Rate:  79 PR Interval:  206 QRS Duration:  130 QT Interval:  408 QTC Calculation: 467 R Axis:   -40  Text  Interpretation: Normal sinus rhythm Left axis deviation Non-specific intra-ventricular conduction block Minimal voltage criteria for LVH, may be normal variant ( Cornell product ) Possible Lateral infarct , age undetermined When compared with ECG of 14-May-2024 10:26, Premature supraventricular complexes are no longer Present Confirmed by Terra Pac ([id number]) on 05/29/2024 3:10:36 PM     Echo 04/18/2024 demonstrated   1. Left ventricular ejection fraction, by estimation, is 55 to 60%. The  left ventricle has normal function. The left ventricle has no regional  wall motion abnormalities. There is severe left ventricular hypertrophy.  Left ventricular diastolic parameters   are indeterminate.   2. Right ventricular systolic function is mildly reduced. The right  ventricular size is normal. There is mildly elevated pulmonary artery  systolic pressure. The estimated right ventricular systolic pressure is  42.4 mmHg.   3. Left atrial size was severely dilated.   4. The mitral valve is degenerative. Trivial mitral valve regurgitation.   5. Tricuspid valve regurgitation is mild to moderate.   6. The aortic valve was not well visualized. Aortic valve regurgitation  is not visualized. Mild aortic valve stenosis.   7. The inferior vena cava is normal in size with greater than 50%  respiratory variability, suggesting right atrial pressure of 3 mmHg.   CHA2DS2-VASc Score = 6  The patient's score is based upon: CHF History: 1 HTN History: 1 Diabetes History: 0 Stroke History: 0 Vascular Disease History: 1 Age Score: 2 Gender Score: 1       ASSESSMENT AND PLAN: Persistent Atrial Fibrillation (ICD10:  I48.19) The patient's CHA2DS2-VASc score is 6, indicating a 9.7% annual risk of stroke.   S/p successful DCCV on 05/14/2024.  Patient is currently in NSR.  Continue Coreg  12.5 mg twice daily. We briefly discussed AAD therapy going forward if ERAF is noted. For now, can continue with conservative  observation.  Secondary Hypercoagulable State (ICD10:  D68.69) The patient is at significant risk for stroke/thromboembolism based upon her CHA2DS2-VASc Score of 6.  Continue Apixaban  (Eliquis ).  Continue Eliquis  2.5 mg twice daily.    Follow up as scheduled with Dr. Wonda. Follow up ~ August in Afib clinic.   Terra Pac, [facility]  Afib Clinic [address] [place], [place] [address] [phone]  "

## 2024-05-29 NOTE — Addendum Note (Signed)
 Encounter addended by: Terra Fairy PARAS, PA-C on: 05/29/2024 3:32 PM  Actions taken: Clinical Note Signed

## 2024-05-31 ENCOUNTER — Encounter: Payer: Self-pay | Admitting: Cardiovascular Disease

## 2024-06-03 ENCOUNTER — Other Ambulatory Visit (HOSPITAL_COMMUNITY): Payer: Self-pay

## 2024-06-04 ENCOUNTER — Telehealth: Payer: Self-pay | Admitting: Cardiovascular Disease

## 2024-06-04 NOTE — Telephone Encounter (Signed)
" °*  STAT* If patient is at the pharmacy, call can be transferred to refill team.   1. Which medications need to be refilled? (please list name of each medication and dose if known)   furosemide  (LASIX ) 20 MG tablet   2. Would you like to learn more about the convenience, safety, & potential cost savings by using the [facility]?   3. Are you open to using the [facility] (Type [facility]. ).  4. Which pharmacy/location (including street and city if local pharmacy) is medication to be sent to?  [id number] - [place] ([place]), [place] - [address]   5. Do they need a 30 day or 90 day supply?   90 day  Patient stated she has a couple of days left of this medication.  Patient has appointment scheduled with Dr. Wonda on 4/6. "

## 2024-06-05 NOTE — Telephone Encounter (Signed)
 Okay to refill furosemide  20 mg daily.  Agree with arranging a closer hospital follow-up visit, likely with one of our APP's based on availability.  Thanks

## 2024-06-06 MED ORDER — FUROSEMIDE 20 MG PO TABS: 20.0000 mg | ORAL_TABLET | Freq: Every day | ORAL | 0 refills | Status: AC

## 2024-06-06 NOTE — Telephone Encounter (Signed)
 Spoke with pt and let her know that we sent a refill for her Lasix  and that we needed to try and move her hospital f/u appt up. Offered pt afternoon appt for tomorrow 1/15. Pt stated her son was not home and she has to check with him to make sure he can take her. Pt stated she would call back this afternoon to let us  know.

## 2024-06-06 NOTE — Telephone Encounter (Signed)
 Have tried to call pt at both #'s listed.  One has a voicemail that has not been set up, the other one voicemail box is full.  Will forward to Dr. Margurite RN, Keven, to try and follow up with pt for follow up.  I will send in the Lasix  and put a note for pt to call the office as well.

## 2024-06-07 ENCOUNTER — Encounter: Payer: Self-pay | Admitting: Cardiovascular Disease

## 2024-06-07 ENCOUNTER — Ambulatory Visit: Attending: Cardiovascular Disease | Admitting: Cardiovascular Disease

## 2024-06-07 VITALS — BP 102/60 | HR 79 | Ht 63.0 in | Wt 122.2 lb

## 2024-06-07 DIAGNOSIS — I10 Essential (primary) hypertension: Secondary | ICD-10-CM | POA: Diagnosis not present

## 2024-06-07 DIAGNOSIS — I480 Paroxysmal atrial fibrillation: Secondary | ICD-10-CM | POA: Diagnosis not present

## 2024-06-07 DIAGNOSIS — I350 Nonrheumatic aortic (valve) stenosis: Secondary | ICD-10-CM

## 2024-06-07 NOTE — Telephone Encounter (Signed)
 Pt scheduled to see Dr. Wonda today in office (06/07/24) at 3:40 PM.

## 2024-06-07 NOTE — Progress Notes (Signed)
 " Cardiology Office Note:    Date:  06/09/2024   ID:  Pamela Robinson, DOB 1932-07-18, MRN [id number]  PCP:  Vernon Velna SAUNDERS, MD   [facility] Providers Cardiologist:  Ozell Fell, MD     Referring MD: Vernon Velna SAUNDERS, MD   Chief Complaint  Patient presents with   Atrial Fibrillation    History of Present Illness:    Pamela Robinson is a 89 y.o. female presenting for follow-up of aortic stenosis.  Recent echo April 18, 2024 showed LVEF 55 to 60%, mild RV dysfunction, and only mild aortic stenosis.  The patient has been followed in the atrial fibrillation clinic, recently seen last week.  She was hospitalized in late November with acute diastolic heart failure felt to be precipitated by atrial fibrillation.  She underwent cardioversion in December and has remained compliant with apixaban .  The patient is feeling much better now. She notices a significant improvement since cardioversion. No further shortness of breath, orthopnea, or PND. No chest pain. Remains compliant with anticoagulation. Her energy level is improved.   Current Medications: Active Medications[1]   Allergies:   Shellfish allergy, Codeine, Sulfa antibiotics, Angiotensin receptor blockers, and Shellfish protein-containing drug products   ROS:   Please see the history of present illness.    All other systems reviewed and are negative.  EKGs/Labs/Other Studies Reviewed:    The following studies were reviewed today: Cardiac Studies & Procedures   ______________________________________________________________________________________________     ECHOCARDIOGRAM  ECHOCARDIOGRAM COMPLETE 04/18/2024  Narrative ECHOCARDIOGRAM REPORT    Patient Name:   Pamela Robinson Date of Exam: 04/18/2024 Medical Rec #:  [id number]          Height:       63.0 in Accession #:    [id number]         Weight:       123.2 lb Date of Birth:  24-Oct-1932         BSA:          1.574 m Patient Age:    89  years           BP:           151/88 mmHg Patient Gender: F                  HR:           79 bpm. Exam Location:  Inpatient  Procedure: 2D Echo, Cardiac Doppler and Color Doppler (Both Spectral and Color Flow Doppler were utilized during procedure).  Indications:    CHF - Acute Systolic  History:        Patient has prior history of Echocardiogram examinations, most recent 10/13/2021. CHF, Signs/Symptoms:Fatigue and Murmur; Risk Factors:Hypertension and Dyslipidemia.  Sonographer:    Juliene Rucks Referring Phys: [phone] NILDA HERO GHERGHE  IMPRESSIONS   1. Left ventricular ejection fraction, by estimation, is 55 to 60%. The left ventricle has normal function. The left ventricle has no regional wall motion abnormalities. There is severe left ventricular hypertrophy. Left ventricular diastolic parameters are indeterminate. 2. Right ventricular systolic function is mildly reduced. The right ventricular size is normal. There is mildly elevated pulmonary artery systolic pressure. The estimated right ventricular systolic pressure is 42.4 mmHg. 3. Left atrial size was severely dilated. 4. The mitral valve is degenerative. Trivial mitral valve regurgitation. 5. Tricuspid valve regurgitation is mild to moderate. 6. The aortic valve was not well visualized. Aortic valve regurgitation is not visualized. Mild aortic valve  stenosis. 7. The inferior vena cava is normal in size with greater than 50% respiratory variability, suggesting right atrial pressure of 3 mmHg.  FINDINGS Left Ventricle: Left ventricular ejection fraction, by estimation, is 55 to 60%. The left ventricle has normal function. The left ventricle has no regional wall motion abnormalities. The left ventricular internal cavity size was small. There is severe left ventricular hypertrophy. Left ventricular diastolic parameters are indeterminate.  Right Ventricle: The right ventricular size is normal. No increase in right ventricular wall  thickness. Right ventricular systolic function is mildly reduced. There is mildly elevated pulmonary artery systolic pressure. The tricuspid regurgitant velocity is 3.14 m/s, and with an assumed right atrial pressure of 3 mmHg, the estimated right ventricular systolic pressure is 42.4 mmHg.  Left Atrium: Left atrial size was severely dilated.  Right Atrium: Right atrial size was normal in size.  Pericardium: There is no evidence of pericardial effusion.  Mitral Valve: The mitral valve is degenerative in appearance. Trivial mitral valve regurgitation.  Tricuspid Valve: The tricuspid valve is normal in structure. Tricuspid valve regurgitation is mild to moderate.  Aortic Valve: The aortic valve was not well visualized. Aortic valve regurgitation is not visualized. Mild aortic stenosis is present.  Pulmonic Valve: The pulmonic valve was grossly normal. Pulmonic valve regurgitation is trivial.  Aorta: The aortic root is normal in size and structure.  Venous: The inferior vena cava is normal in size with greater than 50% respiratory variability, suggesting right atrial pressure of 3 mmHg.  IAS/Shunts: The atrial septum is grossly normal.   LEFT VENTRICLE PLAX 2D LVIDd:         3.90 cm   Diastology LVIDs:         2.60 cm   LV e' medial:    5.44 cm/s LV PW:         1.20 cm   LV E/e' medial:  18.8 LV IVS:        1.10 cm   LV e' lateral:   6.96 cm/s LVOT diam:     1.80 cm   LV E/e' lateral: 14.7 LV SV:         58 LV SV Index:   37 LVOT Area:     2.54 cm   RIGHT VENTRICLE          IVC RV Basal diam:  2.60 cm  IVC diam: 1.00 cm  LEFT ATRIUM              Index        RIGHT ATRIUM           Index LA diam:        4.20 cm  2.67 cm/m   RA Area:     18.00 cm LA Vol (A2C):   56.5 ml  35.90 ml/m  RA Volume:   45.40 ml  28.84 ml/m LA Vol (A4C):   102.0 ml 64.80 ml/m LA Biplane Vol: 80.6 ml  51.21 ml/m AORTIC VALVE LVOT Vmax:   147.00 cm/s LVOT Vmean:  91.300 cm/s LVOT VTI:    0.226  m  AORTA Ao Root diam: 2.80 cm  MITRAL VALVE                TRICUSPID VALVE MV Area (PHT): 4.21 cm     TR Peak grad:   39.4 mmHg MV Decel Time: 180 msec     TR Vmax:        314.00 cm/s MR Peak grad: 64.3 mmHg MR Vmax:  401.00 cm/s   SHUNTS MV E velocity: 102.00 cm/s  Systemic VTI:  0.23 m Systemic Diam: 1.80 cm  Lonni Nanas MD Electronically signed by Lonni Nanas MD Signature Date/Time: 04/18/2024/2:34:45 PM    Final          ______________________________________________________________________________________________      EKG:        Recent Labs: 04/16/2024: Pro Brain Natriuretic Peptide 11,800.0 04/17/2024: ALT 10; TSH 5.117 04/18/2024: Magnesium  1.7 04/19/2024: Hemoglobin 12.6; Platelets 223 04/21/2024: BUN 19; Creatinine, Ser 1.20; Potassium 3.8; Sodium 134  Recent Lipid Panel    Component Value Date/Time   CHOL 116 04/18/2024 0426   TRIG 62 04/18/2024 0426   HDL 49 04/18/2024 0426   CHOLHDL 2.4 04/18/2024 0426   VLDL 12 04/18/2024 0426   LDLCALC 55 04/18/2024 0426     Risk Assessment/Calculations:    CHA2DS2-VASc Score = 6   This indicates a 9.7% annual risk of stroke. The patient's score is based upon: CHF History: 1 HTN History: 1 Diabetes History: 0 Stroke History: 0 Vascular Disease History: 1 Age Score: 2 Gender Score: 1          Physical Exam:    VS:  BP 102/60 (BP Location: Left Arm, Patient Position: Sitting, Cuff Size: Normal)   Pulse 79   Ht 5' 3 (1.6 m)   Wt 122 lb 3.2 oz (55.4 kg)   SpO2 96%   BMI 21.65 kg/m     Wt Readings from Last 3 Encounters:  06/07/24 122 lb 3.2 oz (55.4 kg)  05/29/24 123 lb 14.4 oz (56.2 kg)  05/14/24 117 lb (53.1 kg)     GEN:  Well nourished, well developed in no acute distress HEENT: Normal NECK: No JVD; No carotid bruits LYMPHATICS: No lymphadenopathy CARDIAC: RRR, 2/6 systolic murmur at the RUSB RESPIRATORY:  Clear to auscultation without rales, wheezing or  rhonchi  ABDOMEN: Soft, non-tender, non-distended MUSCULOSKELETAL:  No edema; No deformity  SKIN: Warm and dry NEUROLOGIC:  Alert and oriented x 3 PSYCHIATRIC:  Normal affect   Assessment & Plan Paroxysmal atrial fibrillation (HCC) Continue carvedilol  and apixaban . Successfully cardioverted and feeling better in sinus rhythm after presenting with diastolic heart failure and atrial fibrillation last month. High-risk for recurrent AF with severe LAE on echo. Might need anti-arrhythmic drug Rx in the future. Discussed this with the patient and her family. AFib Clinic notes reviewed.  Nonrheumatic aortic valve stenosis Mild aortic stenosis by exam and echo. Continue clinical follow-up.   Essential hypertension BP controlled. Continue amlodipine  and spironolactone .   The patient appears to be improved with restoration of sinus rhythm. No medication changes are made today. She will follow-up in 3 months with an APP.      Medication Adjustments/Labs and Tests Ordered: Current medicines are reviewed at length with the patient today.  Concerns regarding medicines are outlined above.  No orders of the defined types were placed in this encounter.  No orders of the defined types were placed in this encounter.   Patient Instructions  Medication Instructions:  No medication changes were made at this visit. Continue current regimen.   *If you need a refill on your cardiac medications before your next appointment, please call your pharmacy*  Lab Work: None ordered today. If you have labs (blood work) drawn today and your tests are completely normal, you will receive your results only by: MyChart Message (if you have MyChart) OR A paper copy in the mail If you have any lab test that is  abnormal or we need to change your treatment, we will call you to review the results.  Testing/Procedures: None ordered today.  Follow-Up: At [facility], you and your health needs are our priority.   As part of our continuing mission to provide you with exceptional heart care, our providers are all part of one team.  This team includes your primary Cardiologist (physician) and Advanced Practice Providers or APPs (Physician Assistants and Nurse Practitioners) who all work together to provide you with the care you need, when you need it.  Your next appointment:   3 month(s)  Provider:   One of our Advanced Practice Providers (APPs): Morse Clause, PA-C  Lamarr Satterfield, NP Miriam Shams, NP  Olivia Pavy, PA-C Josefa Beauvais, NP  Leontine Salen, PA-C Orren Fabry, PA-C  [place], PA-C Ernest Dick, NP  Damien Braver, NP Jon Hails, PA-C  Waddell Donath, PA-C    Dayna Dunn, PA-C  Scott Weaver, PA-C Lum Louis, NP Katlyn West, NP Callie Goodrich, PA-C  Xika Zhao, NP Sheng Haley, PA-C    Kathleen Johnson, PA-C    Signed, Ozell Fell, MD  06/09/2024 2:34 PM    [facility]     [1]  Current Meds  Medication Sig   amLODipine  (NORVASC ) 5 MG tablet TAKE 1 TABLET (5 MG TOTAL) BY MOUTH DAILY.   Ascorbic Acid (VITAMIN C PO) Take 1 tablet by mouth daily.   BIOTIN PO Take by mouth.   carvedilol  (COREG ) 12.5 MG tablet Take 1 tablet (12.5 mg total) by mouth 2 (two) times daily with a meal.   Cyanocobalamin  (VITAMIN B-12 PO) Take 1 tablet by mouth daily.   DULoxetine  (CYMBALTA ) 60 MG capsule Take 60 mg by mouth daily.   furosemide  (LASIX ) 20 MG tablet Take 1 tablet (20 mg total) by mouth daily. PT NEEDS A SOONER APPT WITH DR Icie Kuznicki PLEASE CALL OFFICE.SABRA Take additional dose for weight gain of 3lb in 1 day or 5lb in 1 week   mirtazapine  (REMERON ) 15 MG tablet 3 TABLETS AT BEDTIME ORALLY 90 DAYS   Oxycodone  HCl 20 MG TABS Take 1 tablet by mouth 3 (three) times daily as needed.   pregabalin  (LYRICA ) 50 MG capsule TAKE 1 CAPSULE BY MOUTH AT BEDTIME . APPOINTMENT REQUIRED FOR FUTURE REFILLS   simvastatin  (ZOCOR ) 40 MG tablet TAKE 1 TABLET BY MOUTH EVERY EVENING ONCE A DAY    spironolactone  (ALDACTONE ) 25 MG tablet Take 1 tablet (25 mg total) by mouth daily.   VITAMIN D PO Take 1 tablet by mouth daily.   VITAMIN E PO Take 1 tablet by mouth daily.   "

## 2024-06-07 NOTE — Patient Instructions (Signed)
 Medication Instructions:  No medication changes were made at this visit. Continue current regimen.   *If you need a refill on your cardiac medications before your next appointment, please call your pharmacy*  Lab Work: None ordered today. If you have labs (blood work) drawn today and your tests are completely normal, you will receive your results only by: MyChart Message (if you have MyChart) OR A paper copy in the mail If you have any lab test that is abnormal or we need to change your treatment, we will call you to review the results.  Testing/Procedures: None ordered today.  Follow-Up: At [facility], you and your health needs are our priority.  As part of our continuing mission to provide you with exceptional heart care, our providers are all part of one team.  This team includes your primary Cardiologist (physician) and Advanced Practice Providers or APPs (Physician Assistants and Nurse Practitioners) who all work together to provide you with the care you need, when you need it.  Your next appointment:   3 month(s)  Provider:   One of our Advanced Practice Providers (APPs): Morse Clause, PA-C  Lamarr Satterfield, NP Miriam Shams, NP  Olivia Pavy, PA-C Josefa Beauvais, NP  Leontine Salen, PA-C Orren Fabry, PA-C  [place], PA-C Ernest Dick, NP  Damien Braver, NP Jon Hails, PA-C  Waddell Donath, PA-C    Dayna Dunn, PA-C  Scott Weaver, PA-C Lum Louis, NP Katlyn West, NP Callie Goodrich, PA-C  Xika Zhao, NP Sheng Haley, PA-C    Kathleen Johnson, PA-C

## 2024-06-09 ENCOUNTER — Encounter: Payer: Self-pay | Admitting: Cardiovascular Disease

## 2024-06-09 NOTE — Assessment & Plan Note (Signed)
 Continue carvedilol  and apixaban . Successfully cardioverted and feeling better in sinus rhythm after presenting with diastolic heart failure and atrial fibrillation last month. High-risk for recurrent AF with severe LAE on echo. Might need anti-arrhythmic drug Rx in the future. Discussed this with the patient and her family. AFib Clinic notes reviewed.

## 2024-06-20 ENCOUNTER — Ambulatory Visit (HOSPITAL_COMMUNITY)

## 2024-07-31 ENCOUNTER — Ambulatory Visit

## 2024-08-27 ENCOUNTER — Ambulatory Visit: Admitting: Cardiovascular Disease

## 2024-09-05 ENCOUNTER — Ambulatory Visit: Admitting: Physician Assistant
